# Patient Record
Sex: Male | Born: 1985 | Race: Black or African American | Hispanic: No | Marital: Married | Smoking: Never smoker
Health system: Southern US, Community
[De-identification: ages and names within clinical notes are randomized; demographics above are authoritative.]

## PROBLEM LIST (undated history)

## (undated) DIAGNOSIS — I10 Essential (primary) hypertension: Secondary | ICD-10-CM

## (undated) DIAGNOSIS — K5792 Diverticulitis of intestine, part unspecified, without perforation or abscess without bleeding: Secondary | ICD-10-CM

## (undated) HISTORY — PX: FEMUR FRACTURE SURGERY: SHX633

---

## 2005-09-13 ENCOUNTER — Emergency Department (HOSPITAL_COMMUNITY): Admission: EM | Admit: 2005-09-13 | Discharge: 2005-09-14 | Payer: Self-pay | Admitting: Emergency Medicine

## 2011-02-24 ENCOUNTER — Emergency Department (HOSPITAL_COMMUNITY): Payer: Self-pay

## 2011-02-24 ENCOUNTER — Emergency Department (HOSPITAL_COMMUNITY)
Admission: EM | Admit: 2011-02-24 | Discharge: 2011-02-24 | Disposition: A | Discharge status: Home / Self Care | Payer: Self-pay | Attending: Emergency Medicine | Admitting: Emergency Medicine

## 2011-02-24 DIAGNOSIS — M79609 Pain in unspecified limb: Secondary | ICD-10-CM | POA: Insufficient documentation

## 2011-02-24 DIAGNOSIS — M109 Gout, unspecified: Secondary | ICD-10-CM | POA: Insufficient documentation

## 2011-02-24 LAB — CBC
HCT: 45 % (ref 39.0–52.0)
Hemoglobin: 15.7 g/dL (ref 13.0–17.0)
RBC: 5.57 MIL/uL (ref 4.22–5.81)

## 2011-02-24 LAB — URIC ACID: Uric Acid, Serum: 8.5 mg/dL — ABNORMAL HIGH (ref 4.0–7.8)

## 2011-09-28 ENCOUNTER — Encounter (HOSPITAL_COMMUNITY): Payer: Self-pay | Admitting: General Practice

## 2011-09-28 ENCOUNTER — Observation Stay (HOSPITAL_COMMUNITY)
Admission: EM | Admit: 2011-09-28 | Discharge: 2011-09-28 | Disposition: A | Discharge status: Home / Self Care | Payer: Self-pay | Attending: Emergency Medicine | Admitting: Emergency Medicine

## 2011-09-28 DIAGNOSIS — E119 Type 2 diabetes mellitus without complications: Secondary | ICD-10-CM | POA: Insufficient documentation

## 2011-09-28 DIAGNOSIS — L02215 Cutaneous abscess of perineum: Secondary | ICD-10-CM

## 2011-09-28 DIAGNOSIS — J029 Acute pharyngitis, unspecified: Principal | ICD-10-CM | POA: Insufficient documentation

## 2011-09-28 DIAGNOSIS — L02219 Cutaneous abscess of trunk, unspecified: Secondary | ICD-10-CM | POA: Insufficient documentation

## 2011-09-28 DIAGNOSIS — R5381 Other malaise: Secondary | ICD-10-CM | POA: Insufficient documentation

## 2011-09-28 LAB — DIFFERENTIAL
Basophils Absolute: 0 10*3/uL (ref 0.0–0.1)
Basophils Relative: 0 % (ref 0–1)
Eosinophils Absolute: 0 10*3/uL (ref 0.0–0.7)
Eosinophils Relative: 0 % (ref 0–5)
Lymphocytes Relative: 16 % (ref 12–46)
Lymphs Abs: 2.2 10*3/uL (ref 0.7–4.0)
Monocytes Absolute: 1.1 10*3/uL — ABNORMAL HIGH (ref 0.1–1.0)
Monocytes Relative: 8 % (ref 3–12)
Neutro Abs: 10.2 10*3/uL — ABNORMAL HIGH (ref 1.7–7.7)
Neutrophils Relative %: 76 % (ref 43–77)

## 2011-09-28 LAB — COMPREHENSIVE METABOLIC PANEL
ALT: 32 U/L (ref 0–53)
AST: 17 U/L (ref 0–37)
Albumin: 4.2 g/dL (ref 3.5–5.2)
Alkaline Phosphatase: 108 U/L (ref 39–117)
BUN: 16 mg/dL (ref 6–23)
CO2: 23 mEq/L (ref 19–32)
Calcium: 10.4 mg/dL (ref 8.4–10.5)
Chloride: 96 mEq/L (ref 96–112)
Creatinine, Ser: 1.15 mg/dL (ref 0.50–1.35)
GFR calc Af Amer: >90 mL/min (ref >90)
GFR calc non Af Amer: 86 mL/min — ABNORMAL LOW (ref >90)
Glucose, Bld: 671 mg/dL (ref 70–99)
Potassium: 4.5 mEq/L (ref 3.5–5.1)
Sodium: 136 mEq/L (ref 135–145)
Total Bilirubin: 1.6 mg/dL — ABNORMAL HIGH (ref 0.3–1.2)
Total Protein: 8.8 g/dL — ABNORMAL HIGH (ref 6.0–8.3)

## 2011-09-28 LAB — GLUCOSE, CAPILLARY
Glucose-Capillary: >600 mg/dL (ref 70–99)
Glucose-Capillary: 367 mg/dL — ABNORMAL HIGH (ref 70–99)
Glucose-Capillary: 219 mg/dL — ABNORMAL HIGH (ref 70–99)
Glucose-Capillary: 281 mg/dL — ABNORMAL HIGH (ref 70–99)
Glucose-Capillary: 178 mg/dL — ABNORMAL HIGH (ref 70–99)
Glucose-Capillary: 218 mg/dL — ABNORMAL HIGH (ref 70–99)

## 2011-09-28 LAB — CBC
HCT: 43.7 % (ref 39.0–52.0)
Hemoglobin: 15.7 g/dL (ref 13.0–17.0)
MCH: 28.4 pg (ref 26.0–34.0)
MCHC: 35.9 g/dL (ref 30.0–36.0)
MCV: 79 fL (ref 78.0–100.0)
Platelets: 237 10*3/uL (ref 150–400)
RBC: 5.53 MIL/uL (ref 4.22–5.81)
RDW: 12.1 % (ref 11.5–15.5)
WBC: 13.5 10*3/uL — ABNORMAL HIGH (ref 4.0–10.5)

## 2011-09-28 LAB — URINALYSIS, ROUTINE W REFLEX MICROSCOPIC
Bilirubin Urine: NEGATIVE
Glucose, UA: >1000 mg/dL — AB
Ketones, ur: 40 mg/dL — AB
Leukocytes, UA: NEGATIVE
Nitrite: NEGATIVE
Protein, ur: NEGATIVE mg/dL
Specific Gravity, Urine: 1.044 — ABNORMAL HIGH (ref 1.005–1.030)
Urobilinogen, UA: 1 mg/dL (ref 0.0–1.0)
pH: 5.5 (ref 5.0–8.0)

## 2011-09-28 LAB — RAPID STREP SCREEN (MED CTR MEBANE ONLY): Streptococcus, Group A Screen (Direct): NEGATIVE

## 2011-09-28 LAB — POCT I-STAT 3, ART BLOOD GAS (G3+)
Acid-base deficit: 3 mmol/L — ABNORMAL HIGH (ref 0.0–2.0)
O2 Saturation: 99 %
TCO2: 21 mmol/L (ref 0–100)
pCO2 arterial: 30.5 mmHg — ABNORMAL LOW (ref 35.0–45.0)

## 2011-09-28 LAB — URINE MICROSCOPIC-ADD ON

## 2011-09-28 MED ORDER — DEXTROSE 50 % IV SOLN: 25.0000 mL | INTRAVENOUS | Status: DC | PRN

## 2011-09-28 MED ORDER — SODIUM CHLORIDE 0.9 % IV BOLUS (SEPSIS)
1000.0000 mL | Freq: Once | INTRAVENOUS | Status: AC
  Administered 2011-09-28: 1000 mL via INTRAVENOUS

## 2011-09-28 MED ORDER — SODIUM CHLORIDE 0.9 % IV SOLN
INTRAVENOUS | Status: DC
  Administered 2011-09-28: 150 mL/h via INTRAVENOUS
  Administered 2011-09-28: 05:00:00 via INTRAVENOUS

## 2011-09-28 MED ORDER — INSULIN REGULAR BOLUS VIA INFUSION
0.0000 [IU] | Freq: Three times a day (TID) | INTRAVENOUS | Status: DC
  Administered 2011-09-28: 6.4 [IU] via INTRAVENOUS
  Administered 2011-09-28: 4.6 [IU] via INTRAVENOUS
  Filled 2011-09-28 (×2): qty 10

## 2011-09-28 MED ORDER — OXYCODONE-ACETAMINOPHEN 5-325 MG PO TABS: 2.0000 | ORAL_TABLET | ORAL | Status: AC | PRN

## 2011-09-28 MED ORDER — METFORMIN HCL 1000 MG PO TABS: 1000.0000 mg | ORAL_TABLET | Freq: Two times a day (BID) | ORAL | Status: AC

## 2011-09-28 MED ORDER — SODIUM CHLORIDE 0.9 % IV SOLN
INTRAVENOUS | Status: DC
  Administered 2011-09-28: 4.3 [IU]/h via INTRAVENOUS
  Administered 2011-09-28: 7.7 [IU]/h via INTRAVENOUS
  Administered 2011-09-28: 10.4 [IU]/h via INTRAVENOUS
  Filled 2011-09-28 (×2): qty 1

## 2011-09-28 MED ORDER — LIVING WELL WITH DIABETES BOOK
Freq: Once | Status: AC
  Administered 2011-09-28: 14:00:00
  Filled 2011-09-28: qty 1

## 2011-09-28 MED ORDER — SULFAMETHOXAZOLE-TRIMETHOPRIM 800-160 MG PO TABS: 1.0000 | ORAL_TABLET | Freq: Two times a day (BID) | ORAL | Status: AC

## 2011-09-28 NOTE — ED Provider Notes (Signed)
7:36 AM Patient's care assumed in the CDU at am shift change. Report received from [facility]. Pt with new onset diabetes, on hyperglycemia protocol. ABG without acidosis. Electrolytes WNL. Renal function preserved. Plan is to provide diabetic education, ensure ability to tolerate PO, provide assistance with arranging primary care follow-up, and initiate home diabetic rx.  On my assessment, pt is sleeping, comfortable. NSR on telemetry monitor. Lungs CTAB anteriorly. Nml bowel sounds. Will re-assess upon awakening.      9:30 AM Pt eating breakfast. No distress, no additional needs at this time.       12:18 PM Spoke with case manager, who will come to ED to discuss primary care follow-up with patient.     3:40 PM Although pt has not had a blood sugar reading below 150, he has had 5 consecutive measurements below 250. Blood sugar surge in last 2 hours most likely from eating lunch with inadequate carbohydrate coverage. Pt has been given diabetic education packet. He'll be given a prescription for outpatient diabetes clinic followup. He was given a perception for metformin, 1000 mg, twice a day. I discussed with him measuring his blood sugar before meals each day and keeping a log of this. We discussed at length the possible complications of poorly managed diabetes and the patient and his family expressed understanding of these as well as the current plan. The patient will now be discharged home.  Shaaron Adler, [place] 09/28/11 1542

## 2011-09-28 NOTE — ED Notes (Signed)
CBG -- reading "HIGH" (over 600).

## 2011-09-28 NOTE — ED Provider Notes (Signed)
Patient also complains of 3 days of a painful perineal mass on his skin and is worried about an abscess, he has no redness to the skin around the area no trauma no drainage, he is no fever abdominal pain or vomiting. Examination of his groin reveals nontender testicles are nontender scrotal sac but his perineum has a proximally 3 cm diameter tender fluctuant subcutaneous nodule consistent with an abscess without cellulitis.  Procedure: After a time out was taken and a sterile prep, his left perineal region previously being confirmed by me as a fluid collection in the subcutaneous space by limited bedside emergency department ultrasound, was anesthetized with 1% lidocaine with epinephrine and then incised with a #11 scalpel blade with a large amount of purulent drainage, this is a complicated abscess and the loculations were broken up with a forceps, there is no surrounding cellulitis, patient tolerated this well with no apparent immediate complications, dressings are applied and the patient appears stable for discharge home he understands and agrees with this assessment and plan as well. He does not appear to have necrotizing fasciitis cellulitis or sepsis at this time.    Luis Horn, MD 09/29/11 [phone]

## 2011-09-28 NOTE — ED Provider Notes (Signed)
History     CSN: [id number]  Arrival date & time 09/28/11  0040   First MD Initiated Contact with Patient 09/28/11 0130      Chief Complaint  Patient presents with  . Sore Throat     Patient is a 26 y.o. male presenting with weakness. The history is provided by the patient.  Weakness The primary symptoms include dizziness. Primary symptoms do not include headaches, altered mental status, fever, nausea or vomiting. The symptoms began 3 to 5 days ago. The symptoms are worsening. The neurological symptoms are diffuse.  Dizziness also occurs with weakness. Dizziness does not occur with nausea or vomiting.  Additional symptoms include weakness.  Patient  Is a 26 year old obese African American male who reports three-day history of polydipsia, polyuria and increasing with increasing weakness. Weakness became so severe tonight and was associated with intermittent dizziness that he decided to come for evaluation. Patient also reported a mild sore throat. Patient admits to significant family history of diabetes. Patient denies chest pain, shortness of breath, cough or fever. History reviewed. No pertinent past medical history.  No past surgical history on file.  No family history on file.  History  Substance Use Topics  . Smoking status: Never Smoker   . Smokeless tobacco: Not on file  . Alcohol Use: No      Review of Systems  Constitutional: Negative for fever.  HENT: Negative.   Eyes: Negative.   Respiratory: Negative.   Cardiovascular: Negative.   Gastrointestinal: Negative.  Negative for nausea, vomiting, abdominal pain and diarrhea.  Genitourinary: Positive for frequency. Negative for dysuria.  Musculoskeletal: Negative.   Skin: Negative.   Neurological: Positive for dizziness and weakness. Negative for headaches.  Hematological: Negative.   Psychiatric/Behavioral: Negative.  Negative for altered mental status.    Allergies  Review of patient's allergies indicates no  known allergies.  Home Medications   Current Outpatient Rx  Name Route Sig Dispense Refill  . GREEN TEA (CAMILLIA SINENSIS) PO Oral Take 1 tablet by mouth daily before lunch.      BP 134/72  Pulse 101  Temp(Src) 97.8 F (36.6 C) (Oral)  Resp 17  SpO2 95%  Physical Exam  Constitutional: He appears well-developed and well-nourished.  HENT:  Head: Normocephalic and atraumatic.  Mouth/Throat: Uvula is midline and oropharynx is clear and moist. Mucous membranes are dry.  Eyes: Conjunctivae are normal.  Neck: Neck supple.  Cardiovascular: Normal rate and regular rhythm.   Pulmonary/Chest: Effort normal and breath sounds normal.  Abdominal: Soft. Bowel sounds are normal.  Musculoskeletal: Normal range of motion.  Neurological: He is alert.  Skin: Skin is warm and dry.  Psychiatric: He has a normal mood and affect.    ED Course  Procedures  Initial CBG greater than 600. Patient appears clinically dehydrated. Apparent new onset diabetes. Will initiate IV hydration, obtain appropriate screening labs and reassess.  0600: Patient glucose 71 with anion gap of 17. He has been placed on the glucose stabilizer. I have discussed patient with Dr. Hyacinth Meeker. Will obtain ABG and plan for transfer to CDU to continue care at the CTU hyperglycemia protocol.  0630:  ABG shows pH 7.44, CO2 30.5, PO2 124, bicarbonate 20.5, and PCO2 21. Dr. Hyacinth Meeker is aware of these results. Will move to CDU and place in the "CDU Hyperglycemia Protocol". I have discussed this plan with patient and spouse who are both agreeable with plan.  Labs Reviewed  GLUCOSE, CAPILLARY - Abnormal; Notable for the following:  Glucose-Capillary >600 (*)    All other components within normal limits  CBC - Abnormal; Notable for the following:    WBC 13.5 (*)    All other components within normal limits  DIFFERENTIAL - Abnormal; Notable for the following:    Neutro Abs 10.2 (*)    Monocytes Absolute 1.1 (*)    All other  components within normal limits  COMPREHENSIVE METABOLIC PANEL - Abnormal; Notable for the following:    Total Protein 8.8 (*)    Total Bilirubin 1.6 (*)    GFR calc non Af Amer 86 (*)    All other components within normal limits  RAPID STREP SCREEN  URINALYSIS, ROUTINE W REFLEX MICROSCOPIC   No results found.   No diagnosis found.    MDM  HPI/PE and clinical findings c/w 1. New onset diabetes type II (patient with normal pH, no significant acidosis, bicarbonate was 20.5. Patient has been placed on the glucose stabilize, r his last serum glucose was 444. Patient has been transferred to the "CDU Hyperglycemia Protocol for ongoing management of hyperglycemia, education and discharge planning for new-onset type 2 diabetes.        Roma Kayser Liesel Peckenpaugh, NP 09/28/11 [phone]

## 2011-09-28 NOTE — ED Provider Notes (Signed)
Medical screening examination/treatment/procedure(s) were conducted as a shared visit with non-physician practitioner(s) and myself.  I personally evaluated the patient during the encounter  Please see my separate respective documentation pertaining to this patient encounter   Bard Haupert D Davi Rotan, MD 09/28/11 0706 

## 2011-09-28 NOTE — ED Notes (Signed)
Called pharmacy for the second time about needing another insulin drip.

## 2011-09-28 NOTE — ED Provider Notes (Signed)
Medical screening examination/treatment/procedure(s) were conducted as a shared visit with non-physician practitioner(s) and myself.  I personally evaluated the patient during the encounter  Please see my separate respective documentation pertaining to this patient encounter   Vida Roller, MD 09/28/11 [phone]

## 2011-09-28 NOTE — ED Notes (Signed)
Pt to ED c/o sore throat since Wednesday, states difficulty swallowing and "mouth is dry."

## 2011-09-28 NOTE — ED Notes (Signed)
Glucostabilizer pulled up on computer; 40 minutes until next CBG reading.

## 2011-09-28 NOTE — ED Notes (Signed)
[facility] for breakfast tray of carb mod diet.

## 2011-09-28 NOTE — ED Notes (Signed)
Received call from lab regarding elevated glucose @ 671 - will notify MD

## 2011-09-28 NOTE — ED Notes (Signed)
Patient complaining of extreme thirst, urinary frequency, dry mouth, and blurred vision since Wednesday.  Patient states that his throat is "sore", but he feels that it is due to his dry mouth.  Patient denies history of diabetes; CBG checked during assessment -- reading "HIGH" (over 600).  Dry lips and mucous membranes noted upon assessment.  Primary RN and Natalia Leatherwood, FNP notified.  Patient alert and oriented x4; PERRL present.  Family present at bedside.  Upon arrival to room, patient asked to change into gown.  Will continue to monitor.

## 2011-09-28 NOTE — ED Notes (Signed)
BMI is 46.2

## 2011-09-28 NOTE — Progress Notes (Signed)
   CARE MANAGEMENT NOTE 09/28/2011  Patient:  Luis Orozco, Luis Orozco   Account Number:  [id number]  Date Initiated:  09/28/2011  Documentation initiated by:  [facility]  Subjective/Objective Assessment:   DM     Action/Plan:   lives at home with wife   Anticipated DC Date:  09/28/2011   Anticipated DC Plan:  HOME/SELF CARE      DC Planning Services  CM consult  PCP issues      Choice offered to / List presented to:             Status of service:  Completed, signed off Medicare Important Message given?   (If response is "NO", the following Medicare IM given date fields will be blank) Date Medicare IM given:   Date Additional Medicare IM given:    Discharge Disposition:  HOME/SELF CARE  Per UR Regulation:    If discussed at Long Length of Stay Meetings, dates discussed:    Comments:  09/28/2011 1500 Spoke to pt about diabetes and diet. Provided number for outpt diabetes clinic. Gave pt information on [facility] for follow up with diabetes. Pt states he currently has no insurance coverage. Pt will be on Metformin and that medication is available at any pharmacy that offers discount prices. Encouraged pt to shop around for lowest cost. Explained [facility], [facility], [facility] and [facility] has discount prices for medications. Cost can be as low as $4.00. Pt is eligible for [employer] fund but his medication is a generic and can get at discount price at pharmacy. Declined use of ZZ fund at this time. Isidoro Donning RN CCM Case Mgmt phones [phone]

## 2011-09-28 NOTE — Discharge Instructions (Signed)
Diabetes, Type 2 Diabetes is a long-lasting (chronic) disease. In type 2 diabetes, the pancreas does not make enough insulin (a hormone), and the body does not respond normally to the insulin that is made. This type of diabetes was also previously called adult-onset diabetes. It usually occurs after the age of 87, but it can occur at any age.  CAUSES  Type 2 diabetes happens because the pancreasis not making enough insulin or your body has trouble using the insulin that your pancreas does make properly. SYMPTOMS   Drinking more than usual.   Urinating more than usual.   Blurred vision.   Dry, itchy skin.   Frequent infections.   Feeling more tired than usual (fatigue).  DIAGNOSIS The diagnosis of type 2 diabetes is usually made by one of the following tests:  Fasting blood glucose test. You will not eat for at least 8 hours and then take a blood test.   Random blood glucose test. Your blood glucose (sugar) is checked at any time of the day regardless of when you ate.   Oral glucose tolerance test (OGTT). Your blood glucose is measured after you have not eaten (fasted) and then after you drink a glucose containing beverage.  TREATMENT   Healthy eating.   Exercise.   Medicine, if needed.   Monitoring blood glucose.   Seeing your caregiver regularly.  HOME CARE INSTRUCTIONS   Check your blood glucose at least once a day. More frequent monitoring may be necessary, depending on your medicines and on how well your diabetes is controlled. Your caregiver will advise you.   Take your medicine as directed by your caregiver.   Do not smoke.   Make wise food choices. Ask your caregiver for information. Weight loss can improve your diabetes.   Learn about low blood glucose (hypoglycemia) and how to treat it.   Get your eyes checked regularly.   Have a yearly physical exam. Have your blood pressure checked and your blood and urine tested.   Wear a pendant or bracelet saying  that you have diabetes.   Check your feet every night for cuts, sores, blisters, and redness. Let your caregiver know if you have any problems.  SEEK MEDICAL CARE IF:   You have problems keeping your blood glucose in target range.   You have problems with your medicines.   You have symptoms of an illness that do not improve after 24 hours.   You have a sore or wound that is not healing.   You notice a change in vision or a new problem with your vision.   You have a fever.  MAKE SURE YOU:  Understand these instructions.   Will watch your condition.   Will get help right away if you are not doing well or get worse.  Document Released: 06/23/2005 Document Revised: 06/12/2011 Document Reviewed: 12/09/2010 [facility] Patient Information 2012 [place], [place].         Blood Sugar Monitoring, Adult GLUCOSE METERS FOR SELF-MONITORING OF BLOOD GLUCOSE  It is important to be able to correctly measure your blood sugar (glucose). You can use a blood glucose monitor (a small battery-operated device) to check your glucose level at any time. This allows you and your caregiver to monitor your diabetes and to determine how well your treatment plan is working. The process of monitoring your blood glucose with a glucose meter is called self-monitoring of blood glucose (SMBG). When people with diabetes control their blood sugar, they have better health. To test  for glucose with a typical glucose meter, place the disposable strip in the meter. Then place a small sample of blood on the "test strip." The test strip is coated with chemicals that combine with glucose in blood. The meter measures how much glucose is present. The meter displays the glucose level as a number. Several new models can record and store a number of test results. Some models can connect to personal computers to store test results or print them out.  Newer meters are often easier to use than older models. Some meters allow you to get  blood from places other than your fingertip. Some new models have automatic timing, error codes, signals, or barcode readers to help with proper adjustment (calibration). Some meters have a large display screen or spoken instructions for people with visual impairments.  INSTRUCTIONS FOR USING GLUCOSE METERS  Wash your hands with soap and warm water, or clean the area with alcohol. Dry your hands completely.   Prick the side of your fingertip with a lancet (a sharp-pointed tool used by hand).   Hold the hand down and gently milk the finger until a small drop of blood appears. Catch the blood with the test strip.   Follow the instructions for inserting the test strip and using the SMBG meter. Most meters require the meter to be turned on and the test strip to be inserted before applying the blood sample.   Record the test result.   Read the instructions carefully for both the meter and the test strips that go with it. Meter instructions are found in the user manual. Keep this manual to help you solve any problems that may arise. Many meters use "error codes" when there is a problem with the meter, the test strip, or the blood sample on the strip. You will need the manual to understand these error codes and fix the problem.   New devices are available such as laser lancets and meters that can test blood taken from "alternative sites" of the body, other than fingertips. However, you should use standard fingertip testing if your glucose changes rapidly. Also, use standard testing if:   You have eaten, exercised, or taken insulin in the past 2 hours.   You think your glucose is low.   You tend to not feel symptoms of low blood glucose (hypoglycemia).   You are ill or under stress.   Clean the meter as directed by the manufacturer.   Test the meter for accuracy as directed by the manufacturer.   Take your meter with you to your caregiver's office. This way, you can test your glucose in front of  your caregiver to make sure you are using the meter correctly. Your caregiver can also take a sample of blood to test using a routine lab method. If values on the glucose meter are close to the lab results, you and your caregiver will see that your meter is working well and you are using good technique. Your caregiver will advise you about what to do if the results do not match.  FREQUENCY OF TESTING  Your caregiver will tell you how often you should check your blood glucose. This will depend on your type of diabetes, your current level of diabetes control, and your types of medicines. The following are general guidelines, but your care plan may be different. Record all your readings and the time of day you took them for review with your caregiver.   Diabetes type 1.   When  you are using insulin with good diabetic control (either multiple daily injections or via a pump), you should check your glucose 4 times a day.   If your diabetes is not well controlled, you may need to monitor more frequently, including before meals and 2 hours after meals, at bedtime, and occasionally between 2 a.m. and 3 a.m.   You should always check your glucose before a dose of insulin or before changing the rate on your insulin pump.   Diabetes type 2.   Guidelines for SMBG in diabetes type 2 are not as well defined.   If you are on insulin, follow the guidelines above.   If you are on medicines, but not insulin, and your glucose is not well controlled, you should test at least twice daily.   If you are not on insulin, and your diabetes is controlled with medicines or diet alone, you should test at least once daily, usually before breakfast.   A weekly profile will help your caregiver advise you on your care plan. The week before your visit, check your glucose before a meal and 2 hours after a meal at least daily. You may want to test before and after a different meal each day so you and your caregiver can tell how  well controlled your blood sugars are throughout the course of a 24 hour period.   Gestational diabetes (diabetes during pregnancy).   Frequent testing is often necessary. Accurate timing is important.   If you are not on insulin, check your glucose 4 times a day. Check it before breakfast and 1 hour after the start of each meal.   If you are on insulin, check your glucose 6 times a day. Check it before each meal and 1 hour after the first bite of each meal.   General guidelines.   More frequent testing is required at the start of insulin treatment. Your caregiver will instruct you.   Test your glucose any time you suspect you have low blood sugar (hypoglycemia).   You should test more often when you change medicines, when you have unusual stress or illness, or in other unusual circumstances.  OTHER THINGS TO KNOW ABOUT GLUCOSE METERS  Measurement Range. Most glucose meters are able to read glucose levels over a broad range of values from as low as 0 to as high as 600 mg/dL. If you get an extremely high or low reading from your meter, you should first confirm it with another reading. Report very high or very low readings to your caregiver.   Whole Blood Glucose versus Plasma Glucose. Some older home glucose meters measure glucose in your whole blood. In a lab or when using some newer home glucose meters, the glucose is measured in your plasma (one component of blood). The difference can be important. It is important for you and your caregiver to know whether your meter gives its results as "whole blood equivalent" or "plasma equivalent."   Display of High and Low Glucose Values. Part of learning how to operate a meter is understanding what the meter results mean. Know how high and low glucose concentrations are displayed on your meter.   Factors that Affect Glucose Meter Performance. The accuracy of your test results depends on many factors and varies depending on the brand and type of  meter. These factors include:   Low red blood cell count (anemia).   Substances in your blood (such as uric acid, vitamin C, and others).   Environmental factors (temperature,  humidity, altitude).   Name-brand versus generic test strips.   Calibration. Make sure your meter is set up properly. It is a good idea to do a calibration test with a control solution recommended by the manufacturer of your meter whenever you begin using a fresh bottle of test strips. This will help verify the accuracy of your meter.   Improperly stored, expired, or defective test strips. Keep your strips in a dry place with the lid on.   Soiled meter.   Inadequate blood sample.  NEW TECHNOLOGIES FOR GLUCOSE TESTING Alternative site testing Some glucose meters allow testing blood from alternative sites. These include the:  Upper arm.   Forearm.   Base of the thumb.   Thigh.  Sampling blood from alternative sites may be desirable. However, it may have some limitations. Blood in the fingertips show changes in glucose levels more quickly than blood in other parts of the body. This means that alternative site test results may be different from fingertip test results, not because of the meter's ability to test accurately, but because the actual glucose concentration can be different.  Continuous Glucose Monitoring Devices to measure your blood glucose continuously are available, and others are in development. These methods can be more expensive than self-monitoring with a glucose meter. However, it is uncertain how effective and reliable these devices are. Your caregiver will advise you if this approach makes sense for you. IF BLOOD SUGARS ARE CONTROLLED, PEOPLE WITH DIABETES REMAIN HEALTHIER.  SMBG is an important part of the treatment plan of patients with diabetes mellitus. Below are reasons for using SMBG:   It confirms that your glucose is at a specific, healthy level.   It detects hypoglycemia and severe  hyperglycemia.   It allows you and your caregiver to make adjustments in response to changes in lifestyle for individuals requiring medicine.   It determines the need for starting insulin therapy in temporary diabetes that happens during pregnancy (gestational diabetes).  Document Released: 06/26/2003 Document Revised: 06/12/2011 Document Reviewed: 10/17/2010 [facility] Patient Information 2012 [place], [place].         Diabetes Meal Planning Guide The diabetes meal planning guide is a tool to help you plan your meals and snacks. It is important for people with diabetes to manage their blood glucose (sugar) levels. Choosing the right foods and the right amounts throughout your day will help control your blood glucose. Eating right can even help you improve your blood pressure and reach or maintain a healthy weight. CARBOHYDRATE COUNTING MADE EASY When you eat carbohydrates, they turn to sugar. This raises your blood glucose level. Counting carbohydrates can help you control this level so you feel better. When you plan your meals by counting carbohydrates, you can have more flexibility in what you eat and balance your medicine with your food intake. Carbohydrate counting simply means adding up the total amount of carbohydrate grams in your meals and snacks. Try to eat about the same amount at each meal. Foods with carbohydrates are listed below. Each portion below is 1 carbohydrate serving or 15 grams of carbohydrates. Ask your dietician how many grams of carbohydrates you should eat at each meal or snack. Grains and Starches  1 slice bread.    English muffin or hotdog/hamburger bun.    cup cold cereal (unsweetened).   ? cup cooked pasta or rice.    cup starchy vegetables (corn, potatoes, peas, beans, winter squash).   1 tortilla (6 inches).    bagel.   1  waffle or pancake (size of a CD).    cup cooked cereal.   4 to 6 small crackers.  *Whole grain is  recommended. Fruit  1 cup fresh unsweetened berries, melon, papaya, pineapple.   1 small fresh fruit.    banana or mango.    cup fruit juice (4 oz unsweetened).    cup canned fruit in natural juice or water.   2 tbs dried fruit.   12 to 15 grapes or cherries.  Milk and Yogurt  1 cup fat-free or 1% milk.   1 cup soy milk.   6 oz light yogurt with sugar-free sweetener.   6 oz low-fat soy yogurt.   6 oz plain yogurt.  Vegetables  1 cup raw or  cup cooked is counted as 0 carbohydrates or a "free" food.   If you eat 3 or more servings at 1 meal, count them as 1 carbohydrate serving.  Other Carbohydrates   oz chips or pretzels.    cup ice cream or frozen yogurt.    cup sherbet or sorbet.   2 inch square cake, no frosting.   1 tbs honey, sugar, jam, jelly, or syrup.   2 small cookies.   3 squares of graham crackers.   3 cups popcorn.   6 crackers.   1 cup broth-based soup.   Count 1 cup casserole or other mixed foods as 2 carbohydrate servings.   Foods with less than 20 calories in a serving may be counted as 0 carbohydrates or a "free" food.  You may want to purchase a book or computer software that lists the carbohydrate gram counts of different foods. In addition, the nutrition facts panel on the labels of the foods you eat are a good source of this information. The label will tell you how big the serving size is and the total number of carbohydrate grams you will be eating per serving. Divide this number by 15 to obtain the number of carbohydrate servings in a portion. Remember, 1 carbohydrate serving equals 15 grams of carbohydrate. SERVING SIZES Measuring foods and serving sizes helps you make sure you are getting the right amount of food. The list below tells how big or small some common serving sizes are.  1 oz.........4 stacked dice.   3 oz........Marland KitchenDeck of cards.   1 tsp.......Marland KitchenTip of little finger.   1 tbs......Marland KitchenMarland KitchenThumb.   2  tbs.......Marland KitchenGolf ball.    cup......Marland KitchenHalf of a fist.   1 cup.......Marland KitchenA fist.  SAMPLE DIABETES MEAL PLAN Below is a sample meal plan that includes foods from the grain and starches, dairy, vegetable, fruit, and meat groups. A dietician can individualize a meal plan to fit your calorie needs and tell you the number of servings needed from each food group. However, controlling the total amount of carbohydrates in your meal or snack is more important than making sure you include all of the food groups at every meal. You may interchange carbohydrate containing foods (dairy, starches, and fruits). The meal plan below is an example of a 2000 calorie diet using carbohydrate counting. This meal plan has 17 carbohydrate servings. Breakfast  1 cup oatmeal (2 carb servings).    cup light yogurt (1 carb serving).   1 cup blueberries (1 carb serving).    cup almonds.  Snack  1 large apple (2 carb servings).   1 low-fat string cheese stick.  Lunch  Chicken breast salad.   1 cup spinach.    cup chopped tomatoes.   2 oz  chicken breast, sliced.   2 tbs low-fat [place] dressing.   12 whole-wheat crackers (2 carb servings).   12 to 15 grapes (1 carb serving).   1 cup low-fat milk (1 carb serving).  Snack  1 cup carrots.    cup hummus (1 carb serving).  Dinner  3 oz broiled salmon.   1 cup brown rice (3 carb servings).  Snack  1  cups steamed broccoli (1 carb serving) drizzled with 1 tsp olive oil and lemon juice.   1 cup light pudding (2 carb servings).  DIABETES MEAL PLANNING WORKSHEET Your dietician can use this worksheet to help you decide how many servings of foods and what types of foods are right for you.  BREAKFAST Food Group and Servings / Carb Servings Grain/Starches __________________________________ Dairy __________________________________________ Vegetable ______________________________________ Fruit ___________________________________________ Meat  __________________________________________ Fat ____________________________________________ LUNCH Food Group and Servings / Carb Servings Grain/Starches ___________________________________ Dairy ___________________________________________ Fruit ____________________________________________ Meat ___________________________________________ Fat _____________________________________________ [place] Food Group and Servings / Carb Servings Grain/Starches ___________________________________ Dairy ___________________________________________ Fruit ____________________________________________ Meat ___________________________________________ Fat _____________________________________________ SNACKS Food Group and Servings / Carb Servings Grain/Starches ___________________________________ Dairy ___________________________________________ Vegetable _______________________________________ Fruit ____________________________________________ Meat ___________________________________________ Fat _____________________________________________ DAILY TOTALS Starches _________________________ Vegetable ________________________ Fruit ____________________________ Dairy ____________________________ Meat ____________________________ Fat ______________________________ Document Released: 03/20/2005 Document Revised: 06/12/2011 Document Reviewed: 01/29/2009 [employer] Patient Information 2012 [place], [place].Complementary and Alternative Medical Therapies for Diabetes Complementary and alternative medicines are health care practices or products that are not always accepted as part of routine medicine. Complementary medicine is used along with routine medicine (medical therapy). Alternative medicine can sometimes be used instead of routine medicine. Some people use these methods to treat diabetes. While some of these therapies may be effective, others may not be. Some may even be harmful. Patients using these methods  need to tell their caregiver. It is important to let your caregivers know what you are doing. Some of these therapies are discussed below. For more information, talk with your caregiver. THERAPIES Acupuncture Acupuncture is done by a professional who inserts needles into certain points on the skin. Some scientists believe that this triggers the release of the body's natural painkillers. It has been shown to relieve long-term (chronic) pain. This may help patients with painful nerve damage caused by diabetes. Biofeedback Biofeedback helps a person become more aware of the body's response to pain. It also helps you learn to deal with the pain. This alternative therapy focuses on relaxation and stress-reduction techniques. Thinking of peaceful mental images (guided imagery) is one technique. Some people believe these images can ease their condition. MEDICATIONS Chromium Several studies report that chromium supplements may improve diabetes control. Chromium helps insulin improve its action. Research is not yet certain. Supplements have not been recommended or approved. Caution is needed if you have kidney (renal) problems. Ginseng There are several types of ginseng plants. American ginseng is used for diabetes studies. Those studies have shown some glucose-lowering effects. Those effects have been seen with fasting and after-meal blood glucose levels. They have also been seen in A1c levels (average blood glucose levels over a 57-month period). More long-term studies are needed before recommendations for use of ginseng can be made. Magnesium Experts have studied the relationship between magnesium and diabetes for many years. But it is not yet fully understood. Studies suggest that a low amount of magnesium may make blood glucose control worse in type 2 diabetes. Research also shows that a low amount may contribute to certain diabetes complications. One study showed that people who  consume more magnesium had  less risk of type 2 diabetes. Eating whole grains, nuts, and green leafy vegetables raises the magnesium level. Vanadium Vanadium is a compound found in tiny amounts in plants and animals. Early studies showed that vanadium improved blood glucose levels in animals with type 1 and type 2 diabetes. One study found that when given vanadium, those with diabetes were able to decrease their insulin dosage. Researchers still need to learn how it works in the body to discover any side effects, and to find safe dosages. Cinnamon There have been a couple of studies that seem to indicate cinnamon decreases insulin resistance and increases insulin production. By doing so, it may lower blood glucose. Exact doses are unknown, but it may work best when used in combination with other diabetes medicines. Document Released: 04/20/2007 Document Revised: 06/12/2011 Document Reviewed: 05/03/2009 [facility] Patient Information 2012 [place], [place].Abscess An abscess (boil or furuncle) is an infected area that contains a collection of pus.  SYMPTOMS Signs and symptoms of an abscess include pain, tenderness, redness, or hardness. You may feel a moveable soft area under your skin. An abscess can occur anywhere in the body.  TREATMENT  A surgical cut (incision) may be made over your abscess to drain the pus. Gauze may be packed into the space or a drain may be looped through the abscess cavity (pocket). This provides a drain that will allow the cavity to heal from the inside outwards. The abscess may be painful for a few days, but should feel much better if it was drained.  Your abscess, if seen early, may not have localized and may not have been drained. If not, another appointment may be required if it does not get better on its own or with medications. HOME CARE INSTRUCTIONS   Only take over-the-counter or prescription medicines for pain, discomfort, or fever as directed by your caregiver.   Take your antibiotics as  directed if they were prescribed. Finish them even if you start to feel better.   Keep the skin and clothes clean around your abscess.   If the abscess was drained, you will need to use gauze dressing to collect any draining pus. Dressings will typically need to be changed 3 or more times a day.   The infection may spread by skin contact with others. Avoid skin contact as much as possible.   Practice good hygiene. This includes regular hand washing, cover any draining skin lesions, and do not share personal care items.   If you participate in sports, do not share athletic equipment, towels, whirlpools, or personal care items. Shower after every practice or tournament.   If a draining area cannot be adequately covered:   Do not participate in sports.   Children should not participate in day care until the wound has healed or drainage stops.   If your caregiver has given you a follow-up appointment, it is very important to keep that appointment. Not keeping the appointment could result in a much worse infection, chronic or permanent injury, pain, and disability. If there is any problem keeping the appointment, you must call back to this facility for assistance.  SEEK MEDICAL CARE IF:   You develop increased pain, swelling, redness, drainage, or bleeding in the wound site.   You develop signs of generalized infection including muscle aches, chills, fever, or a general ill feeling.   You have an oral temperature above 102 F (38.9 C).  MAKE SURE YOU:   Understand these instructions.  Will watch your condition.   Will get help right away if you are not doing well or get worse.  Document Released: 04/02/2005 Document Revised: 06/12/2011 Document Reviewed: 01/25/2008 [facility] Patient Information 2012 [place], [place].  Return sooner if you develop fever, abdominal pain, vomiting, redness around her groin area, severe pain to the groin area, lightheadedness, or other concerns. Your  abscess area draining and rinsed it out with warm water 3-4 times daily. Recheck in your doctor or the emergency department or urgent care in 2 days.

## 2011-09-28 NOTE — ED Notes (Signed)
26 year old obese male presents with generalized weakness, polyuria, polydipsia. Symptoms have been gradually worsening over the last several days, severe on arrival and associated with some lightheadedness. There is a family history significant for diabetes  Physical exam:  Abdomen soft, lungs clear, heart with mild tachycardia initially, no peripheral edema or rashes. His membranes dehydrated  Assessment:    Patient appears dehydrated secondary to what appears to be a new onset diabetes. Blood work shows that there is no significant acidosis we does have a mild anion gap. ABG confirms normal pH. Bicarbonate normal. Sodium and potassium normal, elevated blood sugar. Insulin by glucose stabilizer ordered, IV fluids given, recheck CBG shows improved, we'll place some hyperglycemia protocol, started outpatient medications. Will need outpatient referrals for family Dr.  Medical screening examination/treatment/procedure(s) were conducted as a shared visit with non-physician practitioner(s) and myself.  I personally evaluated the patient during the encounter   Vida Roller, MD 09/28/11 [phone]

## 2011-09-28 NOTE — ED Notes (Signed)
Received bedside report from Foster, Charity fundraiser.  Patient moved from exam room 07 to CDU 01.  Patient currently sitting up in bed; no respiratory or acute distress noted.  Family at bedside.  Patient given ice water to drink and placed call bell at bedside.  Will continue to monitor.

## 2011-09-28 NOTE — ED Notes (Signed)
Cultures were entered under this pt's name by mistake and could not discontinue the orders. Called lab and they said they would discontinue the orders for me.

## 2011-09-30 ENCOUNTER — Encounter: Payer: Self-pay | Attending: Pediatrics | Admitting: Dietician

## 2011-09-30 DIAGNOSIS — Z713 Dietary counseling and surveillance: Secondary | ICD-10-CM | POA: Insufficient documentation

## 2011-09-30 DIAGNOSIS — E119 Type 2 diabetes mellitus without complications: Secondary | ICD-10-CM | POA: Insufficient documentation

## 2011-10-01 ENCOUNTER — Encounter: Payer: Self-pay | Admitting: Dietician

## 2011-10-01 NOTE — Progress Notes (Signed)
Medical Nutrition Therapy:  Appt start time: 1645 end time:  1745  Assessment:  Primary concerns today: Diagnosed with type 2 diabetes day-before yesterday (09/28/11).  A1C is 12.1 % (Avg glucose of298 mg/dl).  ED admitting glucose was >600 with an altered state of consciousness according to the family.  At the time he was found to have a boil located behind the testicle area.  According to the family, while lowering his blood glucose, the area was lanced and drained, and he was started on an antibiotic.  Has a strong family history for diabetes.  His paternal grandmother died from the complications of diabetes.  He is quite anxious regarding the diagnosis and is seeking answers and support for dealing with the diabetes and "staying as well as I can."  His mother accompanied him to this appointment.   Currently, he has no primary MD.  Suggested he explore finding an MD at [facility] or at [facility].  Also recommended that her contact the [facility] billing department regarding a payment plan or the possibility of being evaluated for an [employer] for [employer] with [employer].  BLOOD GLUCOSE:  Fasting: 248 Post Meals: 288, 306, 245 He does not have a glucose meter of his own, I provided him with a handout for the [employer] Reli-ON meter, strips and lancets.  He felt that he might be able to afford the meter. Currently his Aunt is checking his blood glucose levels with her meter.  MEDICATIONS: Current diabetes medication is Metformin 1000 mg PC for the AM and PM  DIETARY INTAKE:  24-hr recall:  B (8:00 AM): 4 eggs, fried chicken, biscuit with gravy and 16-32 oz of sweet tea.  Snk (mid- AM) :Usually none  L (11:30-12:00 PM): 2 chicken sandwiches or shrimp and grits or 2 double cheeseburgers with mayo and mustard and either 32 oz of sweet tea or Dr. Reino Kent.  Snk (mid PM): None D (02-12-09:00 PM): Fried chicken, or fried pork chop with mash potatoes (1-2 cups) and gravy and biscuit/biscuits.   Snk (HS PM): Usually no snacks Beverages: Sweet tea, Dr. Reino Kent or regular soda  Recent physical activity: No regular exercise regimen.  Works at the 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    Estimated energy needs:HT:72 in  WT: 333.2 lb (151.5 kg)  BMI: 45.3  Adj WT: 237 lb (108 kg) 1800-1900 calories 205-210 g carbohydrates 135-140 g protein 50-52 g fat  Progress Towards Goal(s):  No progress.   Nutritional Diagnosis:  [place]-2.1 Inpaired nutrition utilization As related to glucose.  As evidenced by Recent infection with glucose of >600 mg and A1C of 12.1%.    Intervention:  Nutrition Currently he is morbidly obese and with his diet recall he admits that he eats large portions and will have 2 or sometimes 3 servings.  He has agreed that he will start to decrease his serving size.  He noted in the last 24 hours, he is drinking more water and is using a sugar substitute packet to sweeten his water and has found that to be acceptable.  His mother has tried to help steer him in the direction of balanced meals lower in carb and fried foods.  He agrees to try to bake, broil, grill, roast and avoid fried foods.  Today, he is over whelmed with the diagnosis and is fearful for his future.  Provided him with dietary interventions that  were possible for him.    Handouts given during visit include:  Diet prescription for 75 gm of CHO per meal  Menus for the 75 gm of CHO  List of non-starchy vegetables  Poster using the Plate Method for limiting CHO serving sizes  Monitoring/Evaluation:  Dietary intake, exercise, blood glucose levesl, and body weight in 8 to 12 weeks.

## 2013-04-06 ENCOUNTER — Ambulatory Visit: Payer: Self-pay | Admitting: Podiatry

## 2013-04-13 ENCOUNTER — Encounter: Payer: Self-pay | Admitting: Podiatry

## 2013-04-13 ENCOUNTER — Ambulatory Visit (INDEPENDENT_AMBULATORY_CARE_PROVIDER_SITE_OTHER): Payer: BC Managed Care – PPO | Admitting: Podiatry

## 2013-04-13 VITALS — BP 157/90 | HR 76 | Ht 72.0 in | Wt 322.0 lb

## 2013-04-13 DIAGNOSIS — M21969 Unspecified acquired deformity of unspecified lower leg: Secondary | ICD-10-CM

## 2013-04-13 DIAGNOSIS — M21619 Bunion of unspecified foot: Secondary | ICD-10-CM | POA: Insufficient documentation

## 2013-04-13 DIAGNOSIS — M2010 Hallux valgus (acquired), unspecified foot: Secondary | ICD-10-CM

## 2013-04-13 DIAGNOSIS — B351 Tinea unguium: Secondary | ICD-10-CM

## 2013-04-13 DIAGNOSIS — M79609 Pain in unspecified limb: Secondary | ICD-10-CM

## 2013-04-13 MED ORDER — EFINACONAZOLE 10 % EX SOLN: 1.0000 [drp] | Freq: Every evening | CUTANEOUS | Status: AC

## 2013-04-13 NOTE — Progress Notes (Signed)
Been diabetic x 1 year. Last blood sugar checked was 140 2 weeks ago. Wants general check. Both big toes been hurting. The big toe on right seems to curling up and is changing. On feet about 8 hours a day.   Review of Systems - General ROS: negative for - chills, fatigue, malaise, night sweats, sleep disturbance or weight gain Ophthalmic ROS: negative ENT ROS: negative Respiratory ROS: no cough, shortness of breath, or wheezing Cardiovascular ROS: no chest pain or dyspnea on exertion Gastrointestinal ROS: no abdominal pain, change in bowel habits, or black or bloody stools Genito-Urinary ROS: no dysuria, trouble voiding, or hematuria Musculoskeletal ROS: negative Neurological ROS: no TIA or stroke symptoms Dermatological ROS: negative.  Objective: Vascular: All pedal pulses are palpable. Dermatologic: Discolored and dystrophic nails x 10. Orthopedic: Severe Hallux Valgus with bunion bilateral. Neurologic: All epicritic and tactile sensations grossly intact.  Radiographic examination reveal adductus type foot with severe hallux valgus with bunion. Lower calcaneal pitch bilateral. Increased lateral deviation of the calcaneocuboid angle bilateral.   Assessment: Hallux valgus with bunion deformity bilateral. 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  Plan: Reviewed clinical findings and available treatment options. May benefit from antifungal medication (Jublia). Will return for Orthotics.

## 2013-04-13 NOTE — Patient Instructions (Signed)
Seen for diabetic foot. Abnormal deformed and fungal infected nail. X-ray show significant bunion deformity with flat foot. Apply Jublia topical medication on all toe nails. Will get approval for Orthotics and will call.

## 2014-09-06 ENCOUNTER — Emergency Department (HOSPITAL_BASED_OUTPATIENT_CLINIC_OR_DEPARTMENT_OTHER)
Admission: EM | Admit: 2014-09-06 | Discharge: 2014-09-06 | Disposition: A | Discharge status: Home / Self Care | Payer: Managed Care, Other (non HMO) | Attending: Emergency Medicine | Admitting: Emergency Medicine

## 2014-09-06 ENCOUNTER — Emergency Department (HOSPITAL_BASED_OUTPATIENT_CLINIC_OR_DEPARTMENT_OTHER): Payer: Managed Care, Other (non HMO)

## 2014-09-06 DIAGNOSIS — M7052 Other bursitis of knee, left knee: Secondary | ICD-10-CM

## 2014-09-06 DIAGNOSIS — Z79899 Other long term (current) drug therapy: Secondary | ICD-10-CM | POA: Diagnosis not present

## 2014-09-06 DIAGNOSIS — M25562 Pain in left knee: Secondary | ICD-10-CM | POA: Diagnosis present

## 2014-09-06 DIAGNOSIS — M71562 Other bursitis, not elsewhere classified, left knee: Secondary | ICD-10-CM | POA: Diagnosis not present

## 2014-09-06 DIAGNOSIS — E119 Type 2 diabetes mellitus without complications: Secondary | ICD-10-CM | POA: Diagnosis not present

## 2014-09-06 MED ORDER — NAPROXEN 500 MG PO TABS: 500.0000 mg | ORAL_TABLET | Freq: Two times a day (BID) | ORAL | Status: DC

## 2014-09-06 NOTE — ED Notes (Signed)
Pt states swelling of left knee starting 2 weeks ago; knee is swelling with bogginess

## 2014-09-06 NOTE — ED Notes (Signed)
C/o left knee pain and swelling x 2-3 weeks  Is in firefighter training  A lot of crawling on knees

## 2014-09-06 NOTE — ED Provider Notes (Signed)
CSN: [id number]     Arrival date & time 09/06/14  1924 History   First MD Initiated Contact with Patient 09/06/14 1939     Chief Complaint  Patient presents with  . Knee Pain     (Consider location/radiation/quality/duration/timing/severity/associated sxs/prior Treatment) HPI Comments: 29 year old male complaining of left knee pain 3 weeks, worsening over the past few days. 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 Pain worse with activity. No alleviating factors. No prior injury to his left knee. It started swelling 2 weeks ago. No specific injury that he can recall. Denies color change, fever or warmth.  Patient is a 29 y.o. male presenting with knee pain. The history is provided by the patient.  Knee Pain Location:  Knee Knee location:  L knee Pain details:    Radiates to:  Does not radiate   Onset quality:  Gradual   Timing:  Constant   Progression:  Worsening Chronicity:  New Prior injury to area:  No Worsened by:  Activity   Past Medical History  Diagnosis Date  . Diabetes mellitus    Past Surgical History  Procedure Laterality Date  . Femur fracture surgery     Family History  Problem Relation Age of Onset  . Diabetes Father   . Hyperlipidemia Father   . Hypertension Father   . Heart attack Father   . Obesity Father   . Diabetes Brother   . Diabetes Paternal Grandmother   . Diabetes Brother    History  Substance Use Topics  . Smoking status: Never Smoker   . Smokeless tobacco: Never Used  . Alcohol Use: No    Review of Systems  Musculoskeletal:       +L knee pain and swelling.  All other systems reviewed and are negative.     Allergies  Review of patient's allergies indicates no known allergies.  Home Medications   Prior to Admission medications   Medication Sig Start Date End Date Taking? Authorizing Provider  Efinaconazole (JUBLIA) 10 % SOLN Apply 1 drop topically Nightly. 04/13/13   Myeong  Sheard, DPM  GREEN TEA, CAMILLIA SINENSIS, PO Take 1 tablet by mouth daily before lunch.    Historical Provider, MD  naproxen (NAPROSYN) 500 MG tablet Take 1 tablet (500 mg total) by mouth 2 (two) times daily. 09/06/14   Roselin Wiemann M Farida Mcreynolds, PA-C   BP 156/65 mmHg  Pulse 65  Temp(Src) 98 F (36.7 C) (Oral)  Resp 18  Ht 6' (1.829 m)  Wt 298 lb (135.172 kg)  BMI 40.41 kg/m2  SpO2 98% Physical Exam  Constitutional: He is oriented to person, place, and time. He appears well-developed and well-nourished. No distress.  HENT:  Head: Normocephalic and atraumatic.  Eyes: Conjunctivae and EOM are normal.  Neck: Normal range of motion. Neck supple.  Cardiovascular: Normal rate, regular rhythm and normal heart sounds.   Pulmonary/Chest: Effort normal and breath sounds normal.  Musculoskeletal:  L knee TTP anterior over patella with bogginess and swelling. FROM, pain with flexion. No erythema or warmth.  Neurological: He is alert and oriented to person, place, and time.  Skin: Skin is warm and dry.  Psychiatric: He has a normal mood and affect. His behavior is normal.  Nursing note and vitals reviewed.   ED Course  Procedures (including critical care time) Labs Review Labs Reviewed - No data to display  Imaging Review Dg Knee Complete 4 Views Left  09/06/2014   CLINICAL  DATA:  Left knee swelling for 2 weeks.  EXAM: LEFT KNEE - COMPLETE 4+ VIEW  COMPARISON:  None.  FINDINGS: Soft tissue swelling is seen anterior the patella compatible with prepatellar bursitis. There is no joint effusion. No fracture is identified. There is an osteochondral lesion of the medial femoral condyle measuring approximately 2.1 cm AP x 2.3 cm transverse. Joint spaces are preserved.  IMPRESSION: Prepatellar soft tissue swelling is compatible with bursitis.  Negative for acute bony or joint abnormality.  Osteochondral lesion medial femoral condyle. Nonemergent MRI could be used for further evaluation.   Electronically Signed   By:  Drusilla Kannerhomas  Dalessio M.D.   On: 09/06/2014 20:12     EKG Interpretation None      MDM   Final diagnoses:  Bursitis of left knee   NAD. Neurovascularly intact. No redness, warmth concerning for joint infection. X-ray results as stated above. Knee sleeve applied. Advised RICE, NSAIDs. Rx naproxen. Discussed osteochondral lesion finding. He will follow-up with Dr. Pearletha ForgeHudnall, sports medicine. Stable for d/c. Return precautions given. Patient states understanding of treatment care plan and is agreeable.   Kathrynn SpeedRobyn M Sireen Halk, PA-C 09/06/14 2035  Pricilla LovelessScott Goldston, MD 09/12/14 0005

## 2014-09-06 NOTE — Discharge Instructions (Signed)
Take naproxen as prescribed. Follow up with Dr. Pearletha Forge. Your Xray also mentions Osteochondral lesion medial femoral condyle. Nonemergent MRI could be used for further evaluation. Discuss this with Dr. Pearletha Forge.  Bursitis Bursitis is a swelling and soreness (inflammation) of a fluid-filled sac (bursa) that overlies and protects a joint. It can be caused by injury, overuse of the joint, arthritis or infection. The joints most likely to be affected are the elbows, shoulders, hips and knees. HOME CARE INSTRUCTIONS   Apply ice to the affected area for 15-20 minutes each hour while awake for 2 days. Put the ice in a plastic bag and place a towel between the bag of ice and your skin.  Rest the injured joint as much as possible, but continue to put the joint through a full range of motion, 4 times per day. (The shoulder joint especially becomes rapidly "frozen" if not used.) When the pain lessens, begin normal slow movements and usual activities.  Only take over-the-counter or prescription medicines for pain, discomfort or fever as directed by your caregiver.  Your caregiver may recommend draining the bursa and injecting medicine into the bursa. This may help the healing process.  Follow all instructions for follow-up with your caregiver. This includes any orthopedic referrals, physical therapy and rehabilitation. Any delay in obtaining necessary care could result in a delay or failure of the bursitis to heal and chronic pain. SEEK IMMEDIATE MEDICAL CARE IF:   Your pain increases even during treatment.  You develop an oral temperature above 102 F (38.9 C) and have heat and inflammation over the involved bursa. MAKE SURE YOU:   Understand these instructions.  Will watch your condition.  Will get help right away if you are not doing well or get worse. Document Released: 06/20/2000 Document Revised: 09/15/2011 Document Reviewed: 09/12/2013 [facility] Patient Information 2015 [place], [place]. This  information is not intended to replace advice given to you by your health care provider. Make sure you discuss any questions you have with your health care provider. RICE: Routine Care for Injuries The routine care of many injuries includes Rest, Ice, Compression, and Elevation (RICE). HOME CARE INSTRUCTIONS  Rest is needed to allow your body to heal. Routine activities can usually be resumed when comfortable. Injured tendons and bones can take up to 6 weeks to heal. Tendons are the cord-like structures that attach muscle to bone.  Ice following an injury helps keep the swelling down and reduces pain.  Put ice in a plastic bag.  Place a towel between your skin and the bag.  Leave the ice on for 15-20 minutes, 3-4 times a day, or as directed by your health care provider. Do this while awake, for the first 24 to 48 hours. After that, continue as directed by your caregiver.  Compression helps keep swelling down. It also gives support and helps with discomfort. If an elastic bandage has been applied, it should be removed and reapplied every 3 to 4 hours. It should not be applied tightly, but firmly enough to keep swelling down. Watch fingers or toes for swelling, bluish discoloration, coldness, numbness, or excessive pain. If any of these problems occur, remove the bandage and reapply loosely. Contact your caregiver if these problems continue.  Elevation helps reduce swelling and decreases pain. With extremities, such as the arms, hands, legs, and feet, the injured area should be placed near or above the level of the heart, if possible. SEEK IMMEDIATE MEDICAL CARE IF:  You have persistent pain and swelling.  You develop redness, numbness, or unexpected weakness.  Your symptoms are getting worse rather than improving after several days. These symptoms may indicate that further evaluation or further X-rays are needed. Sometimes, X-rays may not show a small broken bone (fracture) until 1 week or 10  days later. Make a follow-up appointment with your caregiver. Ask when your X-ray results will be ready. Make sure you get your X-ray results. Document Released: 10/05/2000 Document Revised: 06/28/2013 Document Reviewed: 11/22/2010 [facility] Patient Information 2015 [place]. This information is not intended to replace advice given to you by your health care provider. Make sure you discuss any questions you have with your health care provider.

## 2014-09-07 ENCOUNTER — Encounter: Payer: Self-pay | Admitting: Family Medicine

## 2014-09-07 ENCOUNTER — Ambulatory Visit (INDEPENDENT_AMBULATORY_CARE_PROVIDER_SITE_OTHER): Payer: Managed Care, Other (non HMO) | Admitting: Family Medicine

## 2014-09-07 VITALS — BP 156/91 | Ht 72.0 in | Wt 298.0 lb

## 2014-09-07 DIAGNOSIS — M7042 Prepatellar bursitis, left knee: Secondary | ICD-10-CM

## 2014-09-07 DIAGNOSIS — E119 Type 2 diabetes mellitus without complications: Secondary | ICD-10-CM

## 2014-09-07 MED ORDER — METHYLPREDNISOLONE ACETATE 40 MG/ML IJ SUSP
20.0000 mg | Freq: Once | INTRAMUSCULAR | Status: AC
  Administered 2014-09-07: 20 mg via INTRA_ARTICULAR

## 2014-09-07 NOTE — Patient Instructions (Signed)
You have prepatellar bursitis. We aspirated and injected this area today. Keep area wrapped as often as possible. Icing 15 minutes at a time at least 3-4 times a day. Elevate above the level of your heart when possible. Knee pad for any time you may be kneeling or can suffer a blow to the knee. Expect this to swell up some again but the cortisone part should help shrink it back down. Activities as tolerated otherwise. Follow up with me in 1 month.

## 2014-09-12 DIAGNOSIS — E119 Type 2 diabetes mellitus without complications: Secondary | ICD-10-CM | POA: Insufficient documentation

## 2014-09-12 DIAGNOSIS — M7042 Prepatellar bursitis, left knee: Secondary | ICD-10-CM | POA: Insufficient documentation

## 2014-09-12 NOTE — Progress Notes (Signed)
PCP: HASSAN,SAMI, MD  Subjective:   HPI: Patient is a 29 y.o. male here for left knee pain, swelling.  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 Has been doing more running, carrying, squatting, kneeling than usual though does not recall an acute injury. No catching, locking, giving out. + swelling that is worse by the end of the day and with activity. Has been using ACE wrap, biofreeze, icing. Knee feels more stiff. No issues right knee.  Past Medical History  Diagnosis Date  . Diabetes mellitus     Current Outpatient Prescriptions on File Prior to Visit  Medication Sig Dispense Refill  . Efinaconazole (JUBLIA) 10 % SOLN Apply 1 drop topically Nightly. 1 Bottle 3  . GREEN TEA, CAMILLIA SINENSIS, PO Take 1 tablet by mouth daily before lunch.    . naproxen (NAPROSYN) 500 MG tablet Take 1 tablet (500 mg total) by mouth 2 (two) times daily. 30 tablet 0   No current facility-administered medications on file prior to visit.    Past Surgical History  Procedure Laterality Date  . Femur fracture surgery      No Known Allergies  History   Social History  . Marital Status: Significant Other    Spouse Name: N/A  . Number of Children: N/A  . Years of Education: N/A   Occupational History  . Not on file.   Social History Main Topics  . Smoking status: Never Smoker   . Smokeless tobacco: Never Used  . Alcohol Use: No  . Drug Use: No  . Sexual Activity: Yes   Other Topics Concern  . Not on file   Social History Narrative    Family History  Problem Relation Age of Onset  . Diabetes Father   . Hyperlipidemia Father   . Hypertension Father   . Heart attack Father   . Obesity Father   . Diabetes Brother   . Diabetes Paternal Grandmother   . Diabetes Brother     BP 156/91 mmHg  Ht 6' (1.829 m)  Wt 298 lb (135.172 kg)  BMI 40.41 kg/m2  Review of Systems: See HPI above.    Objective:  Physical Exam:  Gen: NAD  Left  knee: Apparent prepatellar bursitis but no erythema. No tenderness of bursa or joint lines. FROM with stiffness on flexion. Negative ant/post drawers. Negative valgus/varus testing. Negative lachmanns. Negative mcmurrays, apleys, patellar apprehension. NV intact distally.    Assessment & Plan:  1. Left prepatellar bursitis - no fevers, chills.  No evidence of infection.  This is limiting him however and decreasing his mobility.  Likely from trauma when kneeling during training.  Aspirated and injected today.  ACE wrap or sleeve, icing, elevation when possible.  Activities as tolerated otherwise.  F/u in 1 month.  After informed written consent patient was seated on exam table.  Area overlying left prepatellar bursa prepped with alcohol swab then injected with 1mL marcaine for local anesthesia.  18mL sanguinous fluid then aspirated from left prepatellar bursa then injected with 0.5:0.665mL marcaine: depomedrol.  Patient tolerated procedure well without immediate complications.

## 2014-09-12 NOTE — Assessment & Plan Note (Signed)
no fevers, chills.  No evidence of infection.  This is limiting him however and decreasing his mobility.  Likely from trauma when kneeling during training.  Aspirated and injected today.  ACE wrap or sleeve, icing, elevation when possible.  Activities as tolerated otherwise.  F/u in 1 month.  After informed written consent patient was seated on exam table.  Area overlying left prepatellar bursa prepped with alcohol swab then injected with 1mL marcaine for local anesthesia.  18mL sanguinous fluid then aspirated from left prepatellar bursa then injected with 0.5:0.215mL marcaine: depomedrol.  Patient tolerated procedure well without immediate complications.

## 2014-09-21 ENCOUNTER — Encounter: Payer: Self-pay | Admitting: Family Medicine

## 2014-09-21 ENCOUNTER — Ambulatory Visit (INDEPENDENT_AMBULATORY_CARE_PROVIDER_SITE_OTHER): Payer: Managed Care, Other (non HMO) | Admitting: Family Medicine

## 2014-09-21 ENCOUNTER — Ambulatory Visit (HOSPITAL_BASED_OUTPATIENT_CLINIC_OR_DEPARTMENT_OTHER)
Admission: RE | Admit: 2014-09-21 | Discharge: 2014-09-21 | Disposition: A | Discharge status: Home / Self Care | Payer: Managed Care, Other (non HMO) | Source: Ambulatory Visit | Attending: Family Medicine | Admitting: Family Medicine

## 2014-09-21 VITALS — BP 153/84 | HR 75 | Ht 72.0 in | Wt 294.0 lb

## 2014-09-21 DIAGNOSIS — S86899A Other injury of other muscle(s) and tendon(s) at lower leg level, unspecified leg, initial encounter: Secondary | ICD-10-CM | POA: Diagnosis not present

## 2014-09-21 DIAGNOSIS — M25551 Pain in right hip: Secondary | ICD-10-CM

## 2014-09-21 DIAGNOSIS — M7042 Prepatellar bursitis, left knee: Secondary | ICD-10-CM | POA: Diagnosis not present

## 2014-09-21 NOTE — Patient Instructions (Addendum)
You have shin splints (medial tibial stress syndrome) Take tylenol and/or aleve as needed for pain. Icing 3-4 times a day and after activity for 15 minutes at a time Orthotics or shoe inserts may be helpful (something like dr. Jari Sportsmanscholls active series). Cross train with non-impact activities (cycling, swimming). Buy new shoes at least every 300 miles or yearly, whichever is sooner. Consider calf socks for compression.  Your have prepatellar bursitis of your left knee but I wouldn't recommend draining this again. Icing, medicines noted above, compression sleeve.  Your left hip pain is due to proximal IT band syndrome and a hip strain. Given the severity of your pain I would recommend no running for the next 2 weeks and I would consider not using stairs, inclines when possible. Follow up with me in 2 weeks for reevaluation.

## 2014-09-27 DIAGNOSIS — M25551 Pain in right hip: Secondary | ICD-10-CM | POA: Insufficient documentation

## 2014-09-27 DIAGNOSIS — S86899A Other injury of other muscle(s) and tendon(s) at lower leg level, unspecified leg, initial encounter: Secondary | ICD-10-CM | POA: Insufficient documentation

## 2014-09-27 NOTE — Progress Notes (Signed)
PCP: HASSAN,SAMI, MD  Subjective:   HPI: Patient is a 29 y.o. male here for left knee pain, swelling.  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 No catching, locking, giving out. + swelling that is worse by the end of the day and with activity. Has been using ACE wrap, biofreeze, icing. Knee feels more stiff. No issues right knee.  3/17: Patient returns with multiple complaints. Left knee still swollen, gets some sharp pains here at times. Both feet are swelling. Also having shin pain worse on the right side (though swollen more on the left). Having his worst pain lateral right hip with radiation some down the thigh. Pain level 8/10 in hip.  Past Medical History  Diagnosis Date  . Diabetes mellitus     Current Outpatient Prescriptions on File Prior to Visit  Medication Sig Dispense Refill  . Efinaconazole (JUBLIA) 10 % SOLN Apply 1 drop topically Nightly. 1 Bottle 3  . GREEN TEA, CAMILLIA SINENSIS, PO Take 1 tablet by mouth daily before lunch.    . metFORMIN (GLUCOPHAGE) 500 MG tablet Take 500 mg by mouth 2 (two) times daily with a meal.    . naproxen (NAPROSYN) 500 MG tablet Take 1 tablet (500 mg total) by mouth 2 (two) times daily. 30 tablet 0  . NAPROXEN DR 500 MG EC tablet   0  . PROAIR HFA 108 (90 BASE) MCG/ACT inhaler   1   No current facility-administered medications on file prior to visit.    Past Surgical History  Procedure Laterality Date  . Femur fracture surgery      No Known Allergies  History   Social History  . Marital Status: Significant Other    Spouse Name: N/A  . Number of Children: N/A  . Years of Education: N/A   Occupational History  . Not on file.   Social History Main Topics  . Smoking status: Never Smoker   . Smokeless tobacco: Never Used  . Alcohol Use: No  . Drug Use: No  . Sexual  Activity: Yes   Other Topics Concern  . Not on file   Social History Narrative    Family History  Problem Relation Age of Onset  . Diabetes Father   . Hyperlipidemia Father   . Hypertension Father   . Heart attack Father   . Obesity Father   . Diabetes Brother   . Diabetes Paternal Grandmother   . Diabetes Brother     BP 153/84 mmHg  Pulse 75  Ht 6' (1.829 m)  Wt 294 lb (133.358 kg)  BMI 39.86 kg/m2  Review of Systems: See HPI above.    Objective:  Physical Exam:  Gen: NAD  Left knee: Apparent prepatellar bursitis but no erythema. No tenderness of bursa or joint lines. FROM with stiffness on flexion. Negative ant/post drawers. Negative valgus/varus testing. Negative lachmanns. Negative mcmurrays, apleys, patellar apprehension. NV intact distally.  Bilateral lower legs: 1-2 + edema lower legs diffusely including ankles. TTP bilateral shins diffusely.  No ankle, foot tenderness. FROM ankles without pain. Strength 5/5 all motions of ankles and knees.  Left hip/back: No gross deformity. TTP lateral hip over proximal IT band.  No midline or bony TTP.  No other hip tenderness. FROM without pain on passive motion. Strength LEs 5/5 all muscle groups.   Negative SLRs. Sensation intact to light touch bilaterally. Negative logroll  bilateral hips Negative fabers and piriformis stretches. Mild pain with hop test.  MSK u/s:  No evidence stress fractures (neovascularity, edema, cortical irregularity) of bilateral tibias.    Assessment & Plan:  1. Bilateral shin splints - Reassured given exam and ultrasound.  Icing, tylenol/nsaids.  Discussed orthotics.  Calf socks for compression.  2. Right hip pain - Radiographs negative.  Exam benign other than proximal IT band tenderness.  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  Stress fracture is unlikely.  Written for no running next 2 weeks to rest given level of pain.  F/u in 2 weeks for a  recheck.  3. Left prepatellar bursitis - 2/2 trauma when kneeling in training.  Advised I would not repeat aspiration.  ACE wrap or sleeve, icing, elevation when possible.

## 2014-09-27 NOTE — Assessment & Plan Note (Signed)
2/2 trauma when kneeling in training.  Advised I would not repeat aspiration.  ACE wrap or sleeve, icing, elevation when possible.

## 2014-09-27 NOTE — Assessment & Plan Note (Signed)
Reassured given exam and ultrasound.  Icing, tylenol/nsaids.  Discussed orthotics.  Calf socks for compression.

## 2014-09-27 NOTE — Assessment & Plan Note (Signed)
Radiographs negative.  Exam benign other than proximal IT band tenderness.  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  Stress fracture is unlikely.  Written for no running next 2 weeks to rest given level of pain.  F/u in 2 weeks for a recheck.

## 2014-10-13 ENCOUNTER — Encounter: Payer: Self-pay | Admitting: Family Medicine

## 2014-10-13 ENCOUNTER — Ambulatory Visit (INDEPENDENT_AMBULATORY_CARE_PROVIDER_SITE_OTHER): Payer: Managed Care, Other (non HMO) | Admitting: Family Medicine

## 2014-10-13 VITALS — BP 147/97 | HR 59 | Ht 72.0 in | Wt 287.0 lb

## 2014-10-13 DIAGNOSIS — M79674 Pain in right toe(s): Secondary | ICD-10-CM

## 2014-10-13 MED ORDER — PREDNISONE (PAK) 10 MG PO TABS: ORAL_TABLET | ORAL | Status: DC

## 2014-10-13 NOTE — Patient Instructions (Signed)
You're cleared fully from all medical issues - no restrictions at this time. Your right great toe pain is likely due to gout. Take prednisone dose pack x 6 days as directed. Day after you finish this you can go back to taking 2 aleve twice a day with food until the pain has resolved. Icing 15 minutes at a time 3-4 times a day. Buddy tape your toes until pain has resolved too. Typically these take 2-4 weeks to get better without treatment - quicker with the above measures. Call me if you have any problems.

## 2014-10-17 DIAGNOSIS — M79674 Pain in right toe(s): Secondary | ICD-10-CM | POA: Insufficient documentation

## 2014-10-17 NOTE — Progress Notes (Signed)
PCP: HASSAN,SAMI, MD  Subjective:   HPI: Patient is a 29 y.o. male here for right foot pain.  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 No catching, locking, giving out. + swelling that is worse by the end of the day and with activity. Has been using ACE wrap, biofreeze, icing. Knee feels more stiff. No issues right knee.  3/17: Patient returns with multiple complaints. Left knee still swollen, gets some sharp pains here at times. Both feet are swelling. Also having shin pain worse on the right side (though swollen more on the left). Having his worst pain lateral right hip with radiation some down the thigh. Pain level 8/10 in hip.  4/8: Patient reports his right hip, left knee are improved though left knee still has a little swelling. Right foot is sore at base of great toe. Has been elevating, icing, taking aleve. Has been worse past week. Pain up to 5/10 - worse with walking, running.  Past Medical History  Diagnosis Date  . Diabetes mellitus     Current Outpatient Prescriptions on File Prior to Visit  Medication Sig Dispense Refill  . Efinaconazole (JUBLIA) 10 % SOLN Apply 1 drop topically Nightly. 1 Bottle 3  . GREEN TEA, CAMILLIA SINENSIS, PO Take 1 tablet by mouth daily before lunch.    . metFORMIN (GLUCOPHAGE) 500 MG tablet Take 500 mg by mouth 2 (two) times daily with a meal.    . naproxen (NAPROSYN) 500 MG tablet Take 1 tablet (500 mg total) by mouth 2 (two) times daily. 30 tablet 0  . NAPROXEN DR 500 MG EC tablet   0  . PROAIR HFA 108 (90 BASE) MCG/ACT inhaler   1   No current facility-administered medications on file prior to visit.    Past Surgical History  Procedure Laterality Date  . Femur fracture surgery      No Known Allergies  History   Social History  . Marital Status: Significant Other    Spouse  Name: N/A  . Number of Children: N/A  . Years of Education: N/A   Occupational History  . Not on file.   Social History Main Topics  . Smoking status: Never Smoker   . Smokeless tobacco: Never Used  . Alcohol Use: No  . Drug Use: No  . Sexual Activity: Yes   Other Topics Concern  . Not on file   Social History Narrative    Family History  Problem Relation Age of Onset  . Diabetes Father   . Hyperlipidemia Father   . Hypertension Father   . Heart attack Father   . Obesity Father   . Diabetes Brother   . Diabetes Paternal Grandmother   . Diabetes Brother     BP 147/97 mmHg  Pulse 59  Ht 6' (1.829 m)  Wt 287 lb (130.182 kg)  BMI 38.92 kg/m2  Review of Systems: See HPI above.    Objective:  Physical Exam:  Gen: NAD  Right foot: No gross deformity, erythema, bruising. Mild-mod limitation extension of 1st MTP.   TTP around 1st MTP. NVI distally with <2 sec cap refill.  Assessment & Plan:  1. Right 1st MTP pain - consistent with acute gout flare.  No injury to suggest fracture or turf toe.  Flare of DJD a possibility.  Will start with prednisone dose pack (discussed increased blood surgars), icing, buddy  taping.  Expect 2-4 weeks at most to resolve.  Cleared for all activities though without restrictions.

## 2014-10-17 NOTE — Assessment & Plan Note (Signed)
consistent with acute gout flare.  No injury to suggest fracture or turf toe.  Flare of DJD a possibility.  Will start with prednisone dose pack (discussed increased blood surgars), icing, buddy taping.  Expect 2-4 weeks at most to resolve.  Cleared for all activities though without restrictions.

## 2020-04-06 ENCOUNTER — Ambulatory Visit (HOSPITAL_COMMUNITY)
Admission: EM | Admit: 2020-04-06 | Discharge: 2020-04-06 | Disposition: A | Discharge status: Home / Self Care | Payer: Managed Care, Other (non HMO) | Attending: Family Medicine | Admitting: Family Medicine

## 2020-04-06 ENCOUNTER — Encounter (HOSPITAL_COMMUNITY): Payer: Self-pay | Admitting: Emergency Medicine

## 2020-04-06 ENCOUNTER — Emergency Department (HOSPITAL_COMMUNITY): Payer: Managed Care, Other (non HMO)

## 2020-04-06 ENCOUNTER — Encounter (HOSPITAL_COMMUNITY): Payer: Self-pay

## 2020-04-06 ENCOUNTER — Other Ambulatory Visit: Payer: Self-pay

## 2020-04-06 ENCOUNTER — Emergency Department (HOSPITAL_COMMUNITY)
Admission: EM | Admit: 2020-04-06 | Discharge: 2020-04-07 | Disposition: A | Discharge status: Home / Self Care | Payer: Managed Care, Other (non HMO) | Attending: Emergency Medicine | Admitting: Emergency Medicine

## 2020-04-06 DIAGNOSIS — R109 Unspecified abdominal pain: Secondary | ICD-10-CM | POA: Diagnosis present

## 2020-04-06 DIAGNOSIS — R112 Nausea with vomiting, unspecified: Secondary | ICD-10-CM

## 2020-04-06 DIAGNOSIS — R1010 Upper abdominal pain, unspecified: Secondary | ICD-10-CM

## 2020-04-06 DIAGNOSIS — Z7984 Long term (current) use of oral hypoglycemic drugs: Secondary | ICD-10-CM | POA: Insufficient documentation

## 2020-04-06 DIAGNOSIS — E119 Type 2 diabetes mellitus without complications: Secondary | ICD-10-CM | POA: Insufficient documentation

## 2020-04-06 LAB — URINALYSIS, ROUTINE W REFLEX MICROSCOPIC
Bacteria, UA: NONE SEEN
Bilirubin Urine: NEGATIVE
Glucose, UA: >=500 mg/dL — AB
Hgb urine dipstick: NEGATIVE
Ketones, ur: 80 mg/dL — AB
Leukocytes,Ua: NEGATIVE
Nitrite: NEGATIVE
Protein, ur: NEGATIVE mg/dL
Specific Gravity, Urine: 1.026 (ref 1.005–1.030)
pH: 8 (ref 5.0–8.0)

## 2020-04-06 LAB — COMPREHENSIVE METABOLIC PANEL
ALT: 15 U/L (ref 0–44)
AST: 15 U/L (ref 15–41)
Albumin: 3.9 g/dL (ref 3.5–5.0)
Alkaline Phosphatase: 83 U/L (ref 38–126)
Anion gap: 14 (ref 5–15)
BUN: 7 mg/dL (ref 6–20)
CO2: 22 mmol/L (ref 22–32)
Calcium: 9.4 mg/dL (ref 8.9–10.3)
Chloride: 100 mmol/L (ref 98–111)
Creatinine, Ser: 0.85 mg/dL (ref 0.61–1.24)
GFR calc Af Amer: >60 mL/min (ref >60)
GFR calc non Af Amer: >60 mL/min (ref >60)
Glucose, Bld: 316 mg/dL — ABNORMAL HIGH (ref 70–99)
Potassium: 4 mmol/L (ref 3.5–5.1)
Sodium: 136 mmol/L (ref 135–145)
Total Bilirubin: 3.4 mg/dL — ABNORMAL HIGH (ref 0.3–1.2)
Total Protein: 7.4 g/dL (ref 6.5–8.1)

## 2020-04-06 LAB — CBC
HCT: 43.6 % (ref 39.0–52.0)
Hemoglobin: 15.2 g/dL (ref 13.0–17.0)
MCH: 27.3 pg (ref 26.0–34.0)
MCHC: 34.9 g/dL (ref 30.0–36.0)
MCV: 78.3 fL — ABNORMAL LOW (ref 80.0–100.0)
Platelets: 290 10*3/uL (ref 150–400)
RBC: 5.57 MIL/uL (ref 4.22–5.81)
RDW: 11.9 % (ref 11.5–15.5)
WBC: 5.9 10*3/uL (ref 4.0–10.5)
nRBC: 0 % (ref 0.0–0.2)

## 2020-04-06 LAB — LIPASE, BLOOD: Lipase: 28 U/L (ref 11–51)

## 2020-04-06 MED ORDER — IOHEXOL 300 MG/ML  SOLN
100.0000 mL | Freq: Once | INTRAMUSCULAR | Status: AC | PRN
  Administered 2020-04-06: 100 mL via INTRAVENOUS

## 2020-04-06 MED ORDER — ONDANSETRON 4 MG PO TBDP
ORAL_TABLET | ORAL | Status: AC
  Filled 2020-04-06: qty 1

## 2020-04-06 MED ORDER — ONDANSETRON 4 MG PO TBDP
4.0000 mg | ORAL_TABLET | Freq: Once | ORAL | Status: AC
  Administered 2020-04-06: 4 mg via ORAL
  Filled 2020-04-06: qty 1

## 2020-04-06 MED ORDER — HYDROCODONE-ACETAMINOPHEN 5-325 MG PO TABS
1.0000 | ORAL_TABLET | Freq: Once | ORAL | Status: AC
  Administered 2020-04-06: 1 via ORAL
  Filled 2020-04-06: qty 1

## 2020-04-06 MED ORDER — ONDANSETRON 4 MG PO TBDP
4.0000 mg | ORAL_TABLET | Freq: Once | ORAL | Status: AC
  Administered 2020-04-06: 4 mg via ORAL

## 2020-04-06 NOTE — ED Triage Notes (Signed)
Pt presents with severe n/v, abd cramping x 7 days. Pt reports symptoms began after eating fish 7 days ago. Pt sent to ED for CT after evaluation at [facility]. Emesis x 6 in last 24 hours.

## 2020-04-06 NOTE — ED Triage Notes (Signed)
Pt present abdominal pain with vomiting. Last Friday patient ate some fish and afterwards he started having cramping. Pt started vomiting today.

## 2020-04-07 LAB — CBG MONITORING, ED: Glucose-Capillary: 312 mg/dL — ABNORMAL HIGH (ref 70–99)

## 2020-04-07 MED ORDER — ALUM & MAG HYDROXIDE-SIMETH 200-200-20 MG/5ML PO SUSP
30.0000 mL | Freq: Once | ORAL | Status: AC
  Administered 2020-04-07: 30 mL via ORAL
  Filled 2020-04-07: qty 30

## 2020-04-07 MED ORDER — SODIUM CHLORIDE 0.9 % IV BOLUS
500.0000 mL | Freq: Once | INTRAVENOUS | Status: AC
  Administered 2020-04-07: 500 mL via INTRAVENOUS

## 2020-04-07 MED ORDER — SUCRALFATE 1 GM/10ML PO SUSP
1.0000 g | Freq: Once | ORAL | Status: AC
Start: 2020-04-07 — End: 2020-04-07
  Administered 2020-04-07: 1 g via ORAL
  Filled 2020-04-07: qty 10

## 2020-04-07 MED ORDER — FAMOTIDINE 20 MG PO TABS: 20.0000 mg | ORAL_TABLET | Freq: Two times a day (BID) | ORAL | 0 refills | Status: AC

## 2020-04-07 MED ORDER — ONDANSETRON HCL 4 MG PO TABS: 4.0000 mg | ORAL_TABLET | Freq: Four times a day (QID) | ORAL | 0 refills | Status: DC

## 2020-04-07 MED ORDER — FAMOTIDINE 20 MG PO TABS
20.0000 mg | ORAL_TABLET | Freq: Once | ORAL | Status: AC
  Administered 2020-04-07: 20 mg via ORAL
  Filled 2020-04-07: qty 1

## 2020-04-07 MED ORDER — LIDOCAINE VISCOUS HCL 2 % MT SOLN
15.0000 mL | Freq: Once | OROMUCOSAL | Status: AC
  Administered 2020-04-07: 15 mL via ORAL
  Filled 2020-04-07: qty 15

## 2020-04-07 MED ORDER — SUCRALFATE 1 GM/10ML PO SUSP: 1.0000 g | Freq: Three times a day (TID) | ORAL | 0 refills | Status: DC

## 2020-04-07 MED ORDER — ONDANSETRON 4 MG PO TBDP
4.0000 mg | ORAL_TABLET | Freq: Once | ORAL | Status: AC
  Administered 2020-04-07: 4 mg via ORAL
  Filled 2020-04-07: qty 1

## 2020-04-07 NOTE — Discharge Instructions (Signed)
Take medication as directed.   Please follow up with your primary doctor within the next 5-7 days.  If you do not have a primary care provider, information for a healthcare clinic has been provided for you to make arrangements for follow up care. Please return to the ER sooner if you have any new or worsening symptoms, or if you have any of the following symptoms:  Abdominal pain that does not go away.  You have a fever.  You keep throwing up (vomiting).  The pain is felt only in portions of the abdomen. Pain in the right side could possibly be appendicitis. In an adult, pain in the left lower portion of the abdomen could be colitis or diverticulitis.  You pass bloody or black tarry stools.  There is bright red blood in the stool.  The constipation stays for more than 4 days.  There is belly (abdominal) or rectal pain.  You do not seem to be getting better.  You have any questions or concerns.   

## 2020-04-07 NOTE — ED Provider Notes (Signed)
[facility] EMERGENCY DEPARTMENT Provider Note   CSN: [id number] Arrival date & time: 04/06/20  2024     History Chief Complaint  Patient presents with  . Abdominal Pain    Luis Orozco is a 34 y.o. male.  HPI   Pt is a 34 y/o male with a h/o DM, who presents to the ED today for eval of abd pain. States that pain started intermittently 1 week ago after eating some store bought fish. Describes is as a cramping pain that has worsened since onset.  He reports associated nausea, vomiting. Denies diarrhea, constipation, urinary sxs.  Last BM was 2 days ago.   Past Medical History:  Diagnosis Date  . Diabetes mellitus     Patient Active Problem List   Diagnosis Date Noted  . Toe pain, right 10/17/2014  . Shin splints 09/27/2014  . Right hip pain 09/27/2014  . Diabetes (HCC) 09/12/2014  . Prepatellar bursitis of left knee 09/12/2014  . Bunion 04/13/2013  . Onychomycosis 04/13/2013  . Metatarsal deformity 04/13/2013    Past Surgical History:  Procedure Laterality Date  . FEMUR FRACTURE SURGERY         Family History  Problem Relation Age of Onset  . Diabetes Father   . Hyperlipidemia Father   . Hypertension Father   . Heart attack Father   . Obesity Father   . Diabetes Brother   . Diabetes Paternal Grandmother   . Diabetes Brother     Social History   Tobacco Use  . Smoking status: Never Smoker  . Smokeless tobacco: Never Used  Substance Use Topics  . Alcohol use: No    Alcohol/week: 0.0 standard drinks  . Drug use: No    Home Medications Prior to Admission medications   Medication Sig Start Date End Date Taking? Authorizing Provider  Efinaconazole (JUBLIA) 10 % SOLN Apply 1 drop topically Nightly. 04/13/13   Sheard, Myeong O, DPM  famotidine (PEPCID) 20 MG tablet Take 1 tablet (20 mg total) by mouth 2 (two) times daily for 14 days. 04/07/20 04/21/20  Able Malloy S, PA-C  GREEN TEA, CAMILLIA SINENSIS, PO Take 1 tablet by mouth  daily before lunch.    [provider]  metFORMIN (GLUCOPHAGE) 500 MG tablet Take 500 mg by mouth 2 (two) times daily with a meal.    [provider]  naproxen (NAPROSYN) 500 MG tablet Take 1 tablet (500 mg total) by mouth 2 (two) times daily. 09/06/14   Hess, Nada Boozer, PA-C  NAPROXEN DR 500 MG EC tablet  09/02/14   [provider]  ondansetron (ZOFRAN) 4 MG tablet Take 1 tablet (4 mg total) by mouth every 6 (six) hours. 04/07/20   Cleotis Sparr S, PA-C  predniSONE (STERAPRED UNI-PAK) 10 MG tablet 6 tabs po day 1, 5 tabs po day 2, 4 tabs po day 3, 3 tabs po day 4, 2 tabs po day 5, 1 tab po day 6 10/13/14   Hudnall, Azucena Fallen, MD  PROAIR HFA 108 (90 BASE) MCG/ACT inhaler  08/14/14   [provider]  sucralfate (CARAFATE) 1 GM/10ML suspension Take 10 mLs (1 g total) by mouth 4 (four) times daily -  with meals and at bedtime. 04/07/20   Maximino Cozzolino S, PA-C    Allergies    Patient has no known allergies.  Review of Systems   Review of Systems  Constitutional: Negative for chills and fever.  HENT: Negative for ear pain and sore throat.  Eyes: Negative for pain and visual disturbance.  Respiratory: Negative for cough and shortness of breath.   Cardiovascular: Negative for chest pain.  Gastrointestinal: Positive for abdominal pain, nausea and vomiting. Negative for blood in stool, constipation and diarrhea.  Genitourinary: Negative for dysuria and hematuria.  Musculoskeletal: Negative for back pain.  Skin: Negative for rash.  Neurological: Negative for headaches.  All other systems reviewed and are negative.   Physical Exam Updated Vital Signs BP 138/78   Pulse 72   Temp 99 F (37.2 C) (Oral)   Resp 16   SpO2 100%   Physical Exam Vitals and nursing note reviewed.  Constitutional:      Appearance: He is well-developed.  HENT:     Head: Normocephalic and atraumatic.  Eyes:     Conjunctiva/sclera: Conjunctivae normal.  Cardiovascular:     Rate and  Rhythm: Normal rate and regular rhythm.     Heart sounds: Normal heart sounds. No murmur heard.   Pulmonary:     Effort: Pulmonary effort is normal. No respiratory distress.     Breath sounds: Normal breath sounds. No wheezing, rhonchi or rales.  Abdominal:     General: Bowel sounds are normal.     Palpations: Abdomen is soft.     Tenderness: There is abdominal tenderness in the epigastric area. There is no guarding or rebound.  Musculoskeletal:     Cervical back: Neck supple.  Skin:    General: Skin is warm and dry.  Neurological:     Mental Status: He is alert.     ED Results / Procedures / Treatments   Labs (all labs ordered are listed, but only abnormal results are displayed) Labs Reviewed  COMPREHENSIVE METABOLIC PANEL - Abnormal; Notable for the following components:      Result Value   Glucose, Bld 316 (*)    Total Bilirubin 3.4 (*)    All other components within normal limits  CBC - Abnormal; Notable for the following components:   MCV 78.3 (*)    All other components within normal limits  URINALYSIS, ROUTINE W REFLEX MICROSCOPIC - Abnormal; Notable for the following components:   APPearance HAZY (*)    Glucose, UA >=500 (*)    Ketones, ur 80 (*)    All other components within normal limits  CBG MONITORING, ED - Abnormal; Notable for the following components:   Glucose-Capillary 312 (*)    All other components within normal limits  LIPASE, BLOOD    EKG None  Radiology CT ABDOMEN PELVIS W CONTRAST  Result Date: 04/06/2020 CLINICAL DATA:  Pt present abdominal pain with vomiting. Last Friday patient ate some fish and afterwards he started having cramping. Pt started vomiting today EXAM: CT ABDOMEN AND PELVIS WITH CONTRAST TECHNIQUE: Multidetector CT imaging of the abdomen and pelvis was performed using the standard protocol following bolus administration of intravenous contrast. CONTRAST:  OMNIPAQUE IOHEXOL 300 MG/ML  SOLN COMPARISON:  None. FINDINGS: Lower  chest: No acute abnormality. Hepatobiliary: Subcentimeter hypodensity within the left hepatic lobe is small to characterize. Otherwise no focal liver abnormality is seen. No gallstones, gallbladder wall thickening, or biliary dilatation. Pancreas: Unremarkable. No pancreatic ductal dilatation or surrounding inflammatory changes. Spleen: Normal in size without focal abnormality. Adrenals/Urinary Tract: No adrenal nodule bilaterally. Bilateral kidneys enhance symmetrically. Punctate nephrolithiasis within the inferior pole of the right kidney open (3:41). Total of 2 calcified stones are noted within the left kidney measuring 0.5 and 0.2 cm (3:39). Subcentimeter hypodensity along the inferior pole  of the left kidney is too small to characterize. No hydronephrosis. No hydroureter. The urinary bladder is unremarkable. Stomach/Bowel: Suggestion of a gastric fundus diverticulum open (6:45, 3:16). Stomach is within normal limits. Appendix appears normal. No evidence of bowel wall thickening, distention, or inflammatory changes. Few scattered colonic diverticula Vascular/Lymphatic: Several phleboliths identified within the pelvis. No abdominal aorta or iliac aneurysm.No abdominal, pelvic, or inguinal lymphadenopathy. Reproductive: Prostate is unremarkable. Other: No intraperitoneal free fluid. No intraperitoneal free gas. No organized fluid collection. Musculoskeletal: No acute or significant osseous findings. Partially visualized fixation of the proximal left femur. IMPRESSION: 1. Suggestion of gastric fundus diverticulum. 2. Nonobstructive bilateral nephrolithiasis (punctate on the right, measuring up to 5 mm on the left). 3. Scattered colonic diverticula with no acute diverticulitis. Electronically Signed   By: Tish FredericksonMorgane  Naveau M.D.   On: 04/06/2020 23:45    Procedures Procedures (including critical care time)  Medications Ordered in ED Medications  ondansetron (ZOFRAN-ODT) disintegrating tablet 4 mg (4 mg Oral  Given 04/06/20 2142)  HYDROcodone-acetaminophen (NORCO/VICODIN) 5-325 MG per tablet 1 tablet (1 tablet Oral Given 04/06/20 2142)  iohexol (OMNIPAQUE) 300 MG/ML solution 100 mL (100 mLs Intravenous Contrast Given 04/06/20 2321)  alum & mag hydroxide-simeth (MAALOX/MYLANTA) 200-200-20 MG/5ML suspension 30 mL (30 mLs Oral Given 04/07/20 0932)    And  lidocaine (XYLOCAINE) 2 % viscous mouth solution 15 mL (15 mLs Oral Given 04/07/20 0932)  sucralfate (CARAFATE) 1 GM/10ML suspension 1 g (1 g Oral Given 04/07/20 1008)  famotidine (PEPCID) tablet 20 mg (20 mg Oral Given 04/07/20 0932)  ondansetron (ZOFRAN-ODT) disintegrating tablet 4 mg (4 mg Oral Given 04/07/20 0932)  sodium chloride 0.9 % bolus 500 mL (500 mLs Intravenous New Bag/Given 04/07/20 0932)    ED Course  I have reviewed the triage vital signs and the nursing notes.  Pertinent labs & imaging results that were available during my care of the patient were reviewed by me and considered in my medical decision making (see chart for details).    MDM Rules/Calculators/A&P                          34 y/o male presenting for eval of abd pain. Epigastric ttp on exam but abd nonsurgical  CBC w/o leukocytosis or anemia CMP with normal electrolytes, kidney function and LFTs. BS elevated but normal bicarb and negative anion gap.  Lipase negative UA neg for UTI. Some ketones noted.   CT abd/pelvis - 1. Suggestion of gastric fundus diverticulum. 2. Nonobstructive bilateral nephrolithiasis (punctate on the right, measuring up to 5 mm on the left). 3. Scattered colonic diverticula with no acute diverticulitis.   Patient was given IV fluids, Zofran, GI cocktail, Carafate and Pepcid.  On reassessment he states he is feeling improved after medications.  He was p.o. challenged and able to tolerate p.o. in the emergency department.  I suspect his symptoms may be related to gastritis and would also consider gastroparesis given his history of diabetes.  Will DC him  with Zofran, Carafate, and Pepcid for home.  Will refer him to gastroenterology for further evaluation.  Have advised on specific return precautions.  He voices understanding of the plan and reasons to return.  All questions answered.  Patient stable for discharge.   Final Clinical Impression(s) / ED Diagnoses Final diagnoses:  Abdominal pain, unspecified abdominal location    Rx / DC Orders ED Discharge Orders         Ordered  ondansetron (ZOFRAN) 4 MG tablet  Every 6 hours        04/07/20 1041    sucralfate (CARAFATE) 1 GM/10ML suspension  3 times daily with meals & bedtime        04/07/20 1041    famotidine (PEPCID) 20 MG tablet  2 times daily        04/07/20 [address], [place], PA-C 04/07/20 1125    Gerhard Munch, MD 04/08/20 [phone]

## 2020-04-08 ENCOUNTER — Telehealth: Payer: Self-pay

## 2020-04-08 NOTE — Telephone Encounter (Signed)
Patient called to state only one prescription came through to his pharmacy, and that was the Pepcid. Called his pharmacy, [facility] to verify , and call in the other two prescriptions he was missing.

## 2020-04-09 ENCOUNTER — Encounter (HOSPITAL_BASED_OUTPATIENT_CLINIC_OR_DEPARTMENT_OTHER): Payer: Self-pay | Admitting: *Deleted

## 2020-04-09 ENCOUNTER — Emergency Department (HOSPITAL_BASED_OUTPATIENT_CLINIC_OR_DEPARTMENT_OTHER)
Admission: EM | Admit: 2020-04-09 | Discharge: 2020-04-10 | Disposition: A | Discharge status: Home / Self Care | Payer: Managed Care, Other (non HMO) | Attending: Emergency Medicine | Admitting: Emergency Medicine

## 2020-04-09 ENCOUNTER — Other Ambulatory Visit: Payer: Self-pay

## 2020-04-09 DIAGNOSIS — Z7984 Long term (current) use of oral hypoglycemic drugs: Secondary | ICD-10-CM | POA: Insufficient documentation

## 2020-04-09 DIAGNOSIS — R112 Nausea with vomiting, unspecified: Secondary | ICD-10-CM | POA: Insufficient documentation

## 2020-04-09 DIAGNOSIS — E119 Type 2 diabetes mellitus without complications: Secondary | ICD-10-CM | POA: Insufficient documentation

## 2020-04-09 DIAGNOSIS — R109 Unspecified abdominal pain: Secondary | ICD-10-CM | POA: Insufficient documentation

## 2020-04-09 LAB — CBC WITH DIFFERENTIAL/PLATELET
Abs Immature Granulocytes: 0.03 10*3/uL (ref 0.00–0.07)
Basophils Absolute: 0 10*3/uL (ref 0.0–0.1)
Basophils Relative: 1 %
Eosinophils Absolute: 0.1 10*3/uL (ref 0.0–0.5)
Eosinophils Relative: 1 %
HCT: 44.4 % (ref 39.0–52.0)
Hemoglobin: 15.6 g/dL (ref 13.0–17.0)
Immature Granulocytes: 1 %
Lymphocytes Relative: 33 %
Lymphs Abs: 2.1 10*3/uL (ref 0.7–4.0)
MCH: 27.9 pg (ref 26.0–34.0)
MCHC: 35.1 g/dL (ref 30.0–36.0)
MCV: 79.4 fL — ABNORMAL LOW (ref 80.0–100.0)
Monocytes Absolute: 0.5 10*3/uL (ref 0.1–1.0)
Monocytes Relative: 8 %
Neutro Abs: 3.6 10*3/uL (ref 1.7–7.7)
Neutrophils Relative %: 56 %
Platelets: 289 10*3/uL (ref 150–400)
RBC: 5.59 MIL/uL (ref 4.22–5.81)
RDW: 11.9 % (ref 11.5–15.5)
WBC: 6.3 10*3/uL (ref 4.0–10.5)
nRBC: 0 % (ref 0.0–0.2)

## 2020-04-09 LAB — URINALYSIS, MICROSCOPIC (REFLEX)
RBC / HPF: NONE SEEN RBC/hpf (ref 0–5)
WBC, UA: NONE SEEN WBC/hpf (ref 0–5)

## 2020-04-09 LAB — COMPREHENSIVE METABOLIC PANEL
ALT: 13 U/L (ref 0–44)
AST: 12 U/L — ABNORMAL LOW (ref 15–41)
Albumin: 3.8 g/dL (ref 3.5–5.0)
Alkaline Phosphatase: 74 U/L (ref 38–126)
Anion gap: 9 (ref 5–15)
BUN: 13 mg/dL (ref 6–20)
CO2: 26 mmol/L (ref 22–32)
Calcium: 9.1 mg/dL (ref 8.9–10.3)
Chloride: 97 mmol/L — ABNORMAL LOW (ref 98–111)
Creatinine, Ser: 0.92 mg/dL (ref 0.61–1.24)
GFR calc Af Amer: >60 mL/min (ref >60)
GFR calc non Af Amer: >60 mL/min (ref >60)
Glucose, Bld: 337 mg/dL — ABNORMAL HIGH (ref 70–99)
Potassium: 3.9 mmol/L (ref 3.5–5.1)
Sodium: 132 mmol/L — ABNORMAL LOW (ref 135–145)
Total Bilirubin: 3.2 mg/dL — ABNORMAL HIGH (ref 0.3–1.2)
Total Protein: 7.3 g/dL (ref 6.5–8.1)

## 2020-04-09 LAB — URINALYSIS, ROUTINE W REFLEX MICROSCOPIC
Bilirubin Urine: NEGATIVE
Glucose, UA: >=500 mg/dL — AB
Hgb urine dipstick: NEGATIVE
Ketones, ur: 15 mg/dL — AB
Leukocytes,Ua: NEGATIVE
Nitrite: NEGATIVE
Protein, ur: NEGATIVE mg/dL
Specific Gravity, Urine: 1.02 (ref 1.005–1.030)
pH: 6 (ref 5.0–8.0)

## 2020-04-09 LAB — CBG MONITORING, ED: Glucose-Capillary: 242 mg/dL — ABNORMAL HIGH (ref 70–99)

## 2020-04-09 LAB — LIPASE, BLOOD: Lipase: 39 U/L (ref 11–51)

## 2020-04-09 MED ORDER — DICYCLOMINE HCL 10 MG/ML IM SOLN
20.0000 mg | Freq: Once | INTRAMUSCULAR | Status: AC
  Administered 2020-04-09: 20 mg via INTRAMUSCULAR
  Filled 2020-04-09: qty 2

## 2020-04-09 MED ORDER — FAMOTIDINE IN NACL 20-0.9 MG/50ML-% IV SOLN
20.0000 mg | Freq: Once | INTRAVENOUS | Status: AC
  Administered 2020-04-09: 20 mg via INTRAVENOUS
  Filled 2020-04-09: qty 50

## 2020-04-09 MED ORDER — ONDANSETRON 4 MG PO TBDP: ORAL_TABLET | ORAL | 0 refills | Status: DC

## 2020-04-09 MED ORDER — DICYCLOMINE HCL 20 MG PO TABS: 20.0000 mg | ORAL_TABLET | Freq: Two times a day (BID) | ORAL | 0 refills | Status: DC

## 2020-04-09 MED ORDER — ONDANSETRON HCL 4 MG/2ML IJ SOLN
4.0000 mg | Freq: Once | INTRAMUSCULAR | Status: AC
  Administered 2020-04-09: 4 mg via INTRAVENOUS
  Filled 2020-04-09: qty 2

## 2020-04-09 MED ORDER — SODIUM CHLORIDE 0.9 % IV BOLUS
2000.0000 mL | Freq: Once | INTRAVENOUS | Status: AC
  Administered 2020-04-09: 1000 mL via INTRAVENOUS

## 2020-04-09 NOTE — Discharge Instructions (Addendum)
Use Zofran and Bentyl in addition to the other medication she was prescribed on Friday to help manage her symptoms. Please call to schedule follow-up appointment with GI, use the phone number on your paperwork from Friday. Try to eat small frequent meals with bland foods for the next few days until your symptoms improve.

## 2020-04-09 NOTE — ED Provider Notes (Signed)
[facility] EMERGENCY DEPARTMENT Provider Note   CSN: [id number] Arrival date & time: 04/09/20  1316     History Chief Complaint  Patient presents with  . Emesis  . Abdominal Pain    Luis Orozco is a 34 y.o. male.  [name] is a 34 y.o. male with a history of diabetes, who presents to the emergency department for continued abdominal cramping, nausea and vomiting.  Patient was seen for similar symptoms in the ED on 10/1 after he states that a week prior he had bought some fish from the store to eat neck, states the fish tasted okay but afterwards he developed abdominal cramping, nausea and vomiting.  In the ED he had a reassuring work-up, CT showed a gastric diverticulum but no other acute findings and lab work was overall reassuring.  He was treated with Zofran, Pepcid and Carafate and states that initially his symptoms were improving, but he has started to have nausea and vomiting once again and is not able to keep much down and it has intermittent abdominal cramping that is severe and very uncomfortable.  Denies diarrhea, constipation or blood in the stool.  No new urinary symptoms.  No fevers or chills.  No chest pain or shortness of breath.  No other foods out of the ordinary.  During recent ED visit patient was referred to GI but he states he has not called to schedule follow-up appointment yet.        Past Medical History:  Diagnosis Date  . Diabetes mellitus     Patient Active Problem List   Diagnosis Date Noted  . Toe pain, right 10/17/2014  . Shin splints 09/27/2014  . Right hip pain 09/27/2014  . Diabetes (HCC) 09/12/2014  . Prepatellar bursitis of left knee 09/12/2014  . Bunion 04/13/2013  . Onychomycosis 04/13/2013  . Metatarsal deformity 04/13/2013    Past Surgical History:  Procedure Laterality Date  . FEMUR FRACTURE SURGERY         Family History  Problem Relation Age of Onset  . Diabetes Father   . Hyperlipidemia Father   .  Hypertension Father   . Heart attack Father   . Obesity Father   . Diabetes Brother   . Diabetes Paternal Grandmother   . Diabetes Brother     Social History   Tobacco Use  . Smoking status: Never Smoker  . Smokeless tobacco: Never Used  Substance Use Topics  . Alcohol use: No    Alcohol/week: 0.0 standard drinks  . Drug use: No    Home Medications Prior to Admission medications   Medication Sig Start Date End Date Taking? Authorizing Provider  dicyclomine (BENTYL) 20 MG tablet Take 1 tablet (20 mg total) by mouth 2 (two) times daily. 04/09/20   Dartha Lodge, PA-C  Efinaconazole (JUBLIA) 10 % SOLN Apply 1 drop topically Nightly. 04/13/13   Sheard, Myeong O, DPM  famotidine (PEPCID) 20 MG tablet Take 1 tablet (20 mg total) by mouth 2 (two) times daily for 14 days. 04/07/20 04/21/20  Couture, Cortni S, PA-C  GREEN TEA, CAMILLIA SINENSIS, PO Take 1 tablet by mouth daily before lunch.    [provider]  metFORMIN (GLUCOPHAGE) 500 MG tablet Take 500 mg by mouth 2 (two) times daily with a meal.    [provider]  naproxen (NAPROSYN) 500 MG tablet Take 1 tablet (500 mg total) by mouth 2 (two) times daily. 09/06/14   Hess, Nada Boozer, PA-C  NAPROXEN DR 500 MG  EC tablet  09/02/14   [provider]  ondansetron (ZOFRAN ODT) 4 MG disintegrating tablet 4mg  ODT q4 hours prn nausea/vomit 04/09/20   06/09/20, PA-C  ondansetron (ZOFRAN) 4 MG tablet Take 1 tablet (4 mg total) by mouth every 6 (six) hours. 04/07/20   Couture, Cortni S, PA-C  predniSONE (STERAPRED UNI-PAK) 10 MG tablet 6 tabs po day 1, 5 tabs po day 2, 4 tabs po day 3, 3 tabs po day 4, 2 tabs po day 5, 1 tab po day 6 10/13/14   Hudnall, 12/13/14, MD  PROAIR HFA 108 (90 BASE) MCG/ACT inhaler  08/14/14   [provider]  sucralfate (CARAFATE) 1 GM/10ML suspension Take 10 mLs (1 g total) by mouth 4 (four) times daily -  with meals and at bedtime. 04/07/20   Couture, Cortni S, PA-C    Allergies    Patient  has no known allergies.  Review of Systems   Review of Systems  Constitutional: Negative for chills and fever.  HENT: Negative.   Respiratory: Negative for cough and shortness of breath.   Cardiovascular: Negative for chest pain.  Gastrointestinal: Positive for abdominal pain, nausea and vomiting. Negative for blood in stool, constipation and diarrhea.  Genitourinary: Negative for dysuria.  Musculoskeletal: Negative for arthralgias and myalgias.  Skin: Negative for color change and rash.  Neurological: Negative for dizziness, syncope and light-headedness.  All other systems reviewed and are negative.   Physical Exam Updated Vital Signs BP (!) 184/113 (BP Location: Right Arm)   Pulse 63   Temp 98.1 F (36.7 C) (Oral)   Resp 14   Ht 6' (1.829 m)   Wt 130.2 kg   SpO2 100%   BMI 38.93 kg/m   Physical Exam Vitals and nursing note reviewed.  Constitutional:      General: He is not in acute distress.    Appearance: He is well-developed. He is not diaphoretic.  HENT:     Head: Normocephalic and atraumatic.     Mouth/Throat:     Comments: Mucous membranes slightly dry Eyes:     General:        Right eye: No discharge.        Left eye: No discharge.     Pupils: Pupils are equal, round, and reactive to light.  Cardiovascular:     Rate and Rhythm: Normal rate and regular rhythm.     Heart sounds: Normal heart sounds.  Pulmonary:     Effort: Pulmonary effort is normal. No respiratory distress.     Breath sounds: Normal breath sounds. No wheezing or rales.     Comments: Respirations equal and unlabored, patient able to speak in full sentences, lungs clear to auscultation bilaterally Abdominal:     General: Bowel sounds are normal. There is no distension.     Palpations: Abdomen is soft. There is no mass.     Tenderness: There is no abdominal tenderness. There is no guarding.     Comments: Abdomen is soft, nondistended, bowel sounds present throughout, patient reports some  slight tenderness only when cramping is present but on exam he has no tenderness, guarding or peritoneal signs  Musculoskeletal:        General: No deformity.     Cervical back: Neck supple.  Skin:    General: Skin is warm and dry.     Capillary Refill: Capillary refill takes less than 2 seconds.  Neurological:     Mental Status: He is alert.  Coordination: Coordination normal.     Comments: Speech is clear, able to follow commands Moves extremities without ataxia, coordination intact  Psychiatric:        Mood and Affect: Mood normal.        Behavior: Behavior normal.     ED Results / Procedures / Treatments   Labs (all labs ordered are listed, but only abnormal results are displayed) Labs Reviewed  COMPREHENSIVE METABOLIC PANEL - Abnormal; Notable for the following components:      Result Value   Sodium 132 (*)    Chloride 97 (*)    Glucose, Bld 337 (*)    AST 12 (*)    Total Bilirubin 3.2 (*)    All other components within normal limits  CBC WITH DIFFERENTIAL/PLATELET - Abnormal; Notable for the following components:   MCV 79.4 (*)    All other components within normal limits  URINALYSIS, ROUTINE W REFLEX MICROSCOPIC - Abnormal; Notable for the following components:   Glucose, UA >=500 (*)    Ketones, ur 15 (*)    All other components within normal limits  URINALYSIS, MICROSCOPIC (REFLEX) - Abnormal; Notable for the following components:   Bacteria, UA RARE (*)    All other components within normal limits  CBG MONITORING, ED - Abnormal; Notable for the following components:   Glucose-Capillary 242 (*)    All other components within normal limits  LIPASE, BLOOD    EKG None  Radiology No results found.  Procedures Procedures (including critical care time)  Medications Ordered in ED Medications  sodium chloride 0.9 % bolus 2,000 mL (0 mLs Intravenous Stopped 04/09/20 2234)  ondansetron (ZOFRAN) injection 4 mg (4 mg Intravenous Given 04/09/20 2109)    famotidine (PEPCID) IVPB 20 mg premix (0 mg Intravenous Stopped 04/09/20 2151)  dicyclomine (BENTYL) injection 20 mg (20 mg Intramuscular Given 04/09/20 2045)    ED Course  I have reviewed the triage vital signs and the nursing notes.  Pertinent labs & imaging results that were available during my care of the patient were reviewed by me and considered in my medical decision making (see chart for details).    MDM Rules/Calculators/A&P                          Patient presents to the ED with complaints of abdominal pain. Patient nontoxic appearing, in no apparent distress, vitals WNL. On exam patient with very mild generalized tednerness, he states pain is primarily during intermittent cramping, no peritoneal signs. Will evaluate with labs, patient had reassuring CT on 10/1 and do not feel that repeating that would be beneficial at this time. Analgesics, anti-emetics, and fluids administered.   ER work-up reviewed:  CBC: No leukocytosis, normal hemoglobin CMP: Sodium of 132 and chloride of 97 suggest dehydration, glucose of 337 with no anion gap and normal CO2, no signs of DKA, normal renal and liver function aside from bili of 3.2 which appears to be chronic for patient and he does not have focal right upper quadrant tenderness. Lipase: WNL UA: No evidence of infection  On repeat abdominal exam patient remains without peritoneal signs, doubt cholecystitis, pancreatitis, diverticulitis, appendicitis, bowel obstruction/perforation. Patient tolerating PO in the emergency department. Will discharge home with supportive measures. I discussed results, treatment plan, need for PCP follow-up, and return precautions with the patient. Provided opportunity for questions, patient confirmed understanding and is in agreement with plan.    Final Clinical Impression(s) / ED Diagnoses Final diagnoses:  Non-intractable vomiting with nausea, unspecified vomiting type  Abdominal cramping    Rx / DC  Orders ED Discharge Orders         Ordered    dicyclomine (BENTYL) 20 MG tablet  2 times daily        04/09/20 2340    ondansetron (ZOFRAN ODT) 4 MG disintegrating tablet        04/09/20 2340           Jodi Geralds [place], [place] 04/12/20 0254    Terald Sleeper, MD 04/14/20 0930

## 2020-04-09 NOTE — ED Triage Notes (Signed)
Abdominal pain and vomiting. He was seen at [facility] 3 days ago for the same.

## 2020-04-09 NOTE — ED Notes (Signed)
Urine sent to lab for hold. No UA orders at this time.

## 2020-04-10 NOTE — ED Provider Notes (Signed)
[facility]   [id number] 04/06/20 Arrival Time: 1835  ASSESSMENT & PLAN:  1. Upper abdominal pain   2. Intractable vomiting with nausea, unspecified vomiting type     Given amount of pain he appears to be in, recommend ED evaluation. He agrees. Stable upon d/c.  Meds ordered this encounter  Medications  . ondansetron (ZOFRAN-ODT) disintegrating tablet 4 mg     Follow-up Information    Go to  [facility] EMERGENCY DEPARTMENT.   Specialty: Emergency Medicine Contact information: [address] [id number] [place] [address] [phone]              Reviewed expectations re: course of current medical issues. Questions answered. Outlined signs and symptoms indicating need for more acute intervention. Understanding verbalized. After Visit Summary given.   SUBJECTIVE: History from: patient. Luis Orozco is a 35 y.o. male who presents with upper abdominal pain; few days; cramping; worsening; n/v beginning today. No fever reported. Ambulatory.    OBJECTIVE:  Vitals:   04/06/20 1941  BP: (!) 153/88  Pulse: 85  Resp: (!) 22  Temp: 97.9 F (36.6 C)  TempSrc: Oral  SpO2: 100%    General appearance: alert; no distress but appears to be in pain; holding arm against abdomen Eyes: PERRLA; EOMI; conjunctiva normal HENT: [place]; AT; without nasal congestion Neck: supple  Lungs: speaks full sentences without difficulty; unlabored Abd: soft; ND; vague TTP but without guarding/rebound Extremities: no edema Skin: warm and dry Neurologic: normal gait Psychological: alert and cooperative; normal mood and affect    No Known Allergies  Past Medical History:  Diagnosis Date  . Diabetes mellitus    Social History   Socioeconomic History  . Marital status: Married    Spouse name: Not on file  . Number of children: Not on file  . Years of education: Not on file  . Highest education level: Not on file    Occupational History  . Not on file  Tobacco Use  . Smoking status: Never Smoker  . Smokeless tobacco: Never Used  Substance and Sexual Activity  . Alcohol use: No    Alcohol/week: 0.0 standard drinks  . Drug use: No  . Sexual activity: Yes  Other Topics Concern  . Not on file  Social History Narrative  . Not on file   Social Determinants of Health   Financial Resource Strain:   . Difficulty of Paying Living Expenses: Not on file  Food Insecurity:   . Worried About Programme researcher, broadcasting/film/video in the Last Year: Not on file  . Ran Out of Food in the Last Year: Not on file  Transportation Needs:   . Lack of Transportation (Medical): Not on file  . Lack of Transportation (Non-Medical): Not on file  Physical Activity:   . Days of Exercise per Week: Not on file  . Minutes of Exercise per Session: Not on file  Stress:   . Feeling of Stress : Not on file  Social Connections:   . Frequency of Communication with Friends and Family: Not on file  . Frequency of Social Gatherings with Friends and Family: Not on file  . Attends Religious Services: Not on file  . Active Member of Clubs or Organizations: Not on file  . Attends Banker Meetings: Not on file  . Marital Status: Not on file  Intimate Partner Violence:   . Fear of Current or Ex-Partner: Not on file  . Emotionally Abused: Not on file  .  Physically Abused: Not on file  . Sexually Abused: Not on file   Family History  Problem Relation Age of Onset  . Diabetes Father   . Hyperlipidemia Father   . Hypertension Father   . Heart attack Father   . Obesity Father   . Diabetes Brother   . Diabetes Paternal Grandmother   . Diabetes Brother    Past Surgical History:  Procedure Laterality Date  . FEMUR FRACTURE SURGERY       Mardella Layman, MD 04/10/20 [phone]

## 2020-09-18 ENCOUNTER — Other Ambulatory Visit: Payer: Self-pay

## 2020-09-18 ENCOUNTER — Emergency Department (HOSPITAL_BASED_OUTPATIENT_CLINIC_OR_DEPARTMENT_OTHER)
Admission: EM | Admit: 2020-09-18 | Discharge: 2020-09-19 | Disposition: A | Discharge status: Home / Self Care | Payer: Managed Care, Other (non HMO) | Attending: Emergency Medicine | Admitting: Emergency Medicine

## 2020-09-18 DIAGNOSIS — R1030 Lower abdominal pain, unspecified: Secondary | ICD-10-CM | POA: Insufficient documentation

## 2020-09-18 DIAGNOSIS — R1084 Generalized abdominal pain: Secondary | ICD-10-CM

## 2020-09-18 DIAGNOSIS — E119 Type 2 diabetes mellitus without complications: Secondary | ICD-10-CM | POA: Diagnosis not present

## 2020-09-18 DIAGNOSIS — Z7984 Long term (current) use of oral hypoglycemic drugs: Secondary | ICD-10-CM | POA: Diagnosis not present

## 2020-09-18 DIAGNOSIS — R112 Nausea with vomiting, unspecified: Secondary | ICD-10-CM | POA: Diagnosis not present

## 2020-09-18 MED ORDER — FENTANYL CITRATE (PF) 100 MCG/2ML IJ SOLN
50.0000 ug | INTRAMUSCULAR | Status: DC | PRN
  Administered 2020-09-19: 50 ug via INTRAVENOUS
  Filled 2020-09-18: qty 2

## 2020-09-18 MED ORDER — ONDANSETRON HCL 4 MG/2ML IJ SOLN
4.0000 mg | Freq: Once | INTRAMUSCULAR | Status: AC | PRN
  Administered 2020-09-19: 4 mg via INTRAVENOUS
  Filled 2020-09-18: qty 2

## 2020-09-18 NOTE — ED Triage Notes (Signed)
Pt states abdominal pain this morning had bm and emesis and tried to lay down did not feel better. States last time was Dx with diverticulitis. Unable to hold down foods or fluids.

## 2020-09-19 LAB — COMPREHENSIVE METABOLIC PANEL
ALT: 15 U/L (ref 0–44)
AST: 17 U/L (ref 15–41)
Albumin: 4.2 g/dL (ref 3.5–5.0)
Alkaline Phosphatase: 91 U/L (ref 38–126)
Anion gap: 13 (ref 5–15)
BUN: 11 mg/dL (ref 6–20)
CO2: 23 mmol/L (ref 22–32)
Calcium: 9.8 mg/dL (ref 8.9–10.3)
Chloride: 100 mmol/L (ref 98–111)
Creatinine, Ser: 0.69 mg/dL (ref 0.61–1.24)
GFR, Estimated: >60 mL/min (ref >60)
Glucose, Bld: 336 mg/dL — ABNORMAL HIGH (ref 70–99)
Potassium: 4.3 mmol/L (ref 3.5–5.1)
Sodium: 136 mmol/L (ref 135–145)
Total Bilirubin: 2.1 mg/dL — ABNORMAL HIGH (ref 0.3–1.2)
Total Protein: 8.3 g/dL — ABNORMAL HIGH (ref 6.5–8.1)

## 2020-09-19 LAB — CBC WITH DIFFERENTIAL/PLATELET
Abs Immature Granulocytes: 0.01 10*3/uL (ref 0.00–0.07)
Basophils Absolute: 0 10*3/uL (ref 0.0–0.1)
Basophils Relative: 0 %
Eosinophils Absolute: 0 10*3/uL (ref 0.0–0.5)
Eosinophils Relative: 0 %
HCT: 44.6 % (ref 39.0–52.0)
Hemoglobin: 16.1 g/dL (ref 13.0–17.0)
Immature Granulocytes: 0 %
Lymphocytes Relative: 18 %
Lymphs Abs: 1.3 10*3/uL (ref 0.7–4.0)
MCH: 28 pg (ref 26.0–34.0)
MCHC: 36.1 g/dL — ABNORMAL HIGH (ref 30.0–36.0)
MCV: 77.6 fL — ABNORMAL LOW (ref 80.0–100.0)
Monocytes Absolute: 0.3 10*3/uL (ref 0.1–1.0)
Monocytes Relative: 5 %
Neutro Abs: 5.4 10*3/uL (ref 1.7–7.7)
Neutrophils Relative %: 77 %
Platelets: 275 10*3/uL (ref 150–400)
RBC: 5.75 MIL/uL (ref 4.22–5.81)
RDW: 11.9 % (ref 11.5–15.5)
WBC: 7 10*3/uL (ref 4.0–10.5)
nRBC: 0 % (ref 0.0–0.2)

## 2020-09-19 LAB — LIPASE, BLOOD: Lipase: 35 U/L (ref 11–51)

## 2020-09-19 MED ORDER — ONDANSETRON 8 MG PO TBDP: 8.0000 mg | ORAL_TABLET | Freq: Three times a day (TID) | ORAL | 0 refills | Status: AC | PRN

## 2020-09-19 NOTE — ED Provider Notes (Signed)
[facility] Provider Note: Lowella Dell, MD, FACEP  CSN: [id number] MRN: [id number] ARRIVAL: 09/18/20 at 2332 ROOM: MH02/MH02   CHIEF COMPLAINT  Abdominal Pain   HISTORY OF PRESENT ILLNESS  09/19/20 1:50 AM Luis Orozco is a 35 y.o. male who had nausea and vomiting beginning yesterday morning while at work.  He was unable to hold down food or fluids.  He subsequently developed lower abdominal pain primarily in the middle and lower left.  He describes the pain as feeling like his abdomen was going to explode.  It was worse with movement or palpation.  He rated the pain is a 10 out of 10.  He was given 50 mcg of fentanyl and 4 mg of Zofran IV per protocol about 2 hours ago and is now asymptomatic.   Past Medical History:  Diagnosis Date  . Diabetes mellitus     Past Surgical History:  Procedure Laterality Date  . FEMUR FRACTURE SURGERY      Family History  Problem Relation Age of Onset  . Diabetes Father   . Hyperlipidemia Father   . Hypertension Father   . Heart attack Father   . Obesity Father   . Diabetes Brother   . Diabetes Paternal Grandmother   . Diabetes Brother     Social History   Tobacco Use  . Smoking status: Never Smoker  . Smokeless tobacco: Never Used  Substance Use Topics  . Alcohol use: No    Alcohol/week: 0.0 standard drinks  . Drug use: No    Prior to Admission medications   Medication Sig Start Date End Date Taking? Authorizing Provider  ondansetron (ZOFRAN ODT) 8 MG disintegrating tablet Take 1 tablet (8 mg total) by mouth every 8 (eight) hours as needed for nausea or vomiting. 09/19/20  Yes Nola Botkins, MD  dicyclomine (BENTYL) 20 MG tablet Take 1 tablet (20 mg total) by mouth 2 (two) times daily. 04/09/20   Dartha Lodge, PA-C  Efinaconazole (JUBLIA) 10 % SOLN Apply 1 drop topically Nightly. 04/13/13   Sheard, Myeong O, DPM  famotidine (PEPCID) 20 MG tablet Take 1 tablet (20 mg total) by mouth 2 (two) times daily for 14  days. 04/07/20 04/21/20  Couture, Cortni S, PA-C  GREEN TEA, CAMILLIA SINENSIS, PO Take 1 tablet by mouth daily before lunch.    [provider]  metFORMIN (GLUCOPHAGE) 500 MG tablet Take 500 mg by mouth 2 (two) times daily with a meal.    [provider]  PROAIR HFA 108 (90 BASE) MCG/ACT inhaler  08/14/14   [provider]  sucralfate (CARAFATE) 1 GM/10ML suspension Take 10 mLs (1 g total) by mouth 4 (four) times daily -  with meals and at bedtime. 04/07/20   Couture, Cortni S, PA-C    Allergies Patient has no known allergies.   REVIEW OF SYSTEMS  Negative except as noted here or in the History of Present Illness.   PHYSICAL EXAMINATION  Initial Vital Signs Blood pressure (!) 148/91, pulse 78, temperature 98.5 F (36.9 C), temperature source Oral, resp. rate (!) 21, height 6' (1.829 m), weight 97.6 kg, SpO2 99 %.  Examination General: Well-developed, well-nourished male in no acute distress; appearance consistent with age of record HENT: normocephalic; atraumatic Eyes: pupils equal, round and reactive to light; extraocular muscles intact Neck: supple Heart: regular rate and rhythm Lungs: clear to auscultation bilaterally Abdomen: soft; nondistended; nontender; no masses or hepatosplenomegaly; bowel sounds present Extremities: No deformity; full range of motion; pulses  normal Neurologic: Awake, alert and oriented; motor function intact in all extremities and symmetric; no facial droop Skin: Warm and dry Psychiatric: Normal mood and affect   RESULTS  Summary of this visit's results, reviewed and interpreted by myself:   EKG Interpretation  Date/Time:    Ventricular Rate:    PR Interval:    QRS Duration:   QT Interval:    QTC Calculation:   R Axis:     Text Interpretation:        Laboratory Studies: Results for orders placed or performed during the hospital encounter of 09/18/20 (from the past 24 hour(s))  Lipase, blood     Status: None    Collection Time: 09/19/20 12:05 AM  Result Value Ref Range   Lipase 35 11 - 51 U/L  Comprehensive metabolic panel     Status: Abnormal   Collection Time: 09/19/20 12:05 AM  Result Value Ref Range   Sodium 136 135 - 145 mmol/L   Potassium 4.3 3.5 - 5.1 mmol/L   Chloride 100 98 - 111 mmol/L   CO2 23 22 - 32 mmol/L   Glucose, Bld 336 (H) 70 - 99 mg/dL   BUN 11 6 - 20 mg/dL   Creatinine, Ser 0.01 0.61 - 1.24 mg/dL   Calcium 9.8 8.9 - 74.9 mg/dL   Total Protein 8.3 (H) 6.5 - 8.1 g/dL   Albumin 4.2 3.5 - 5.0 g/dL   AST 17 15 - 41 U/L   ALT 15 0 - 44 U/L   Alkaline Phosphatase 91 38 - 126 U/L   Total Bilirubin 2.1 (H) 0.3 - 1.2 mg/dL   GFR, Estimated >44 >96 mL/min   Anion gap 13 5 - 15  CBC with Differential     Status: Abnormal   Collection Time: 09/19/20 12:05 AM  Result Value Ref Range   WBC 7.0 4.0 - 10.5 K/uL   RBC 5.75 4.22 - 5.81 MIL/uL   Hemoglobin 16.1 13.0 - 17.0 g/dL   HCT 75.9 16.3 - 84.6 %   MCV 77.6 (L) 80.0 - 100.0 fL   MCH 28.0 26.0 - 34.0 pg   MCHC 36.1 (H) 30.0 - 36.0 g/dL   RDW 65.9 93.5 - 70.1 %   Platelets 275 150 - 400 K/uL   nRBC 0.0 0.0 - 0.2 %   Neutrophils Relative % 77 %   Neutro Abs 5.4 1.7 - 7.7 K/uL   Lymphocytes Relative 18 %   Lymphs Abs 1.3 0.7 - 4.0 K/uL   Monocytes Relative 5 %   Monocytes Absolute 0.3 0.1 - 1.0 K/uL   Eosinophils Relative 0 %   Eosinophils Absolute 0.0 0.0 - 0.5 K/uL   Basophils Relative 0 %   Basophils Absolute 0.0 0.0 - 0.1 K/uL   Immature Granulocytes 0 %   Abs Immature Granulocytes 0.01 0.00 - 0.07 K/uL   Imaging Studies: No results found.  ED COURSE and MDM  Nursing notes, initial and subsequent vitals signs, including pulse oximetry, reviewed and interpreted by myself.  Vitals:   09/18/20 2345 09/18/20 2347 09/19/20 0017 09/19/20 0149  BP:  (!) 148/98 (!) 157/100 (!) 148/91  Pulse:  98 78 78  Resp:  (!) 24  (!) 21  Temp:  98.5 F (36.9 C)  98.5 F (36.9 C)  TempSrc:  Oral  Oral  SpO2:  100% 100% 99%   Weight: 97.6 kg     Height: 6' (1.829 m)      Medications  fentaNYL (SUBLIMAZE) injection 50  mcg (50 mcg Intravenous Given 09/19/20 0013)  ondansetron (ZOFRAN) injection 4 mg (4 mg Intravenous Given 09/19/20 0013)   The patient's symptoms have abated.  His examination is benign at this time.  His laboratory studies are within normal limits except for hyperglycemia (patient has known diabetes) and mildly elevated total bilirubin.  He was advised to return if symptoms worsen but I do not believe any imaging studies or other work-up are indicated at this time   PROCEDURES  Procedures   ED DIAGNOSES     ICD-10-CM   1. Generalized abdominal pain  R10.84   2. Nausea and vomiting in adult  R11.2        Paula Libra, MD 09/19/20 [phone]

## 2021-04-04 IMAGING — CT CT ABD-PELV W/ CM
2 of 3 series · 16 of 46 positions shown, 18 images · IV contrast (APPLIED)
Comparison: None.

CLINICAL DATA: Pt present abdominal pain with vomiting. [REDACTED]
patient ate some fish and afterwards he started having cramping. Pt
started vomiting today

EXAM:
CT ABDOMEN AND PELVIS WITH CONTRAST
TECHNIQUE: Multidetector CT imaging of the abdomen and pelvis was performed
using the standard protocol following bolus administration of
intravenous contrast.
CONTRAST:  100mL OMNIPAQUE IOHEXOL 300 MG/ML  SOLN

[Series 5: lung · axial · 0.97mm/px · z∈[+1281,+1401]mm · 13 of 70 slices shown, 15 images]
[im 5/70  soft-tissue]
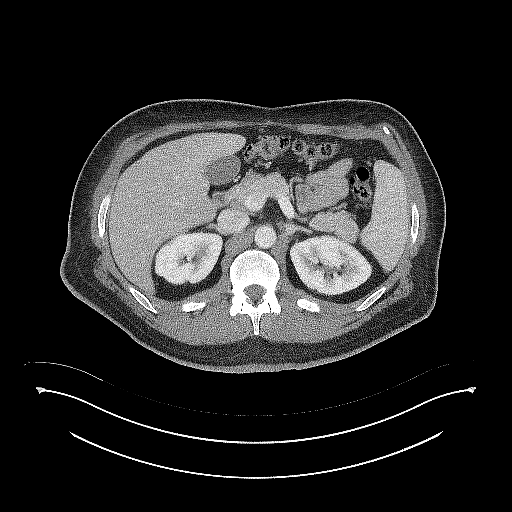
[im 5/70  bone]
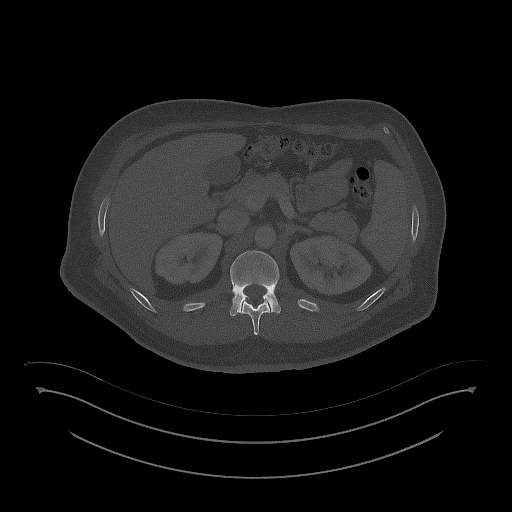
[im 9/70  soft-tissue]
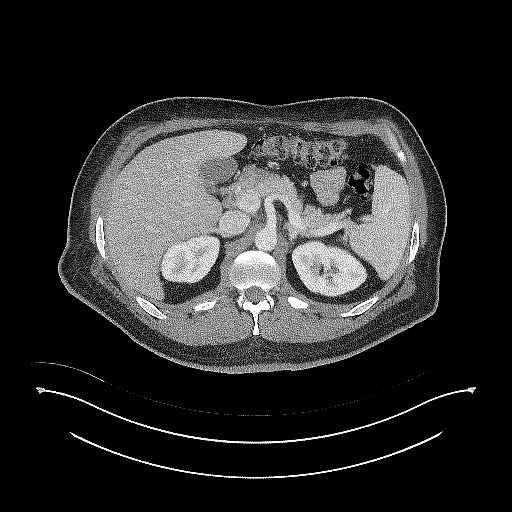
[im 14/70  soft-tissue]
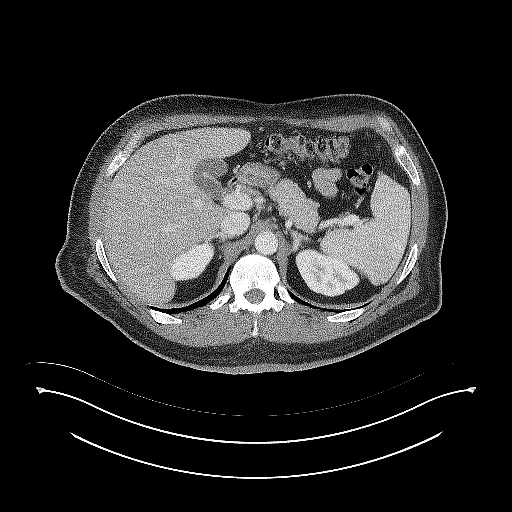
[im 21/70  soft-tissue]
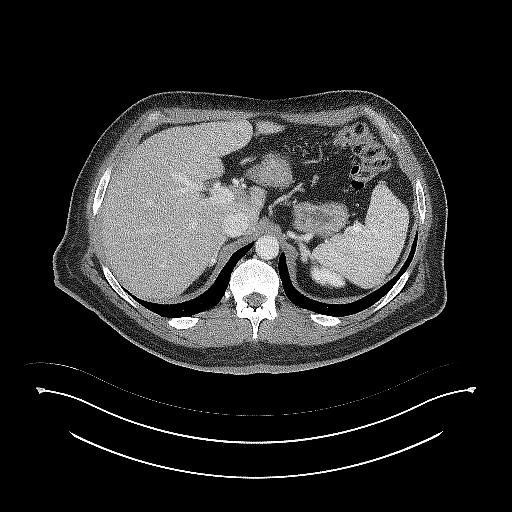
[im 25/70  soft-tissue]
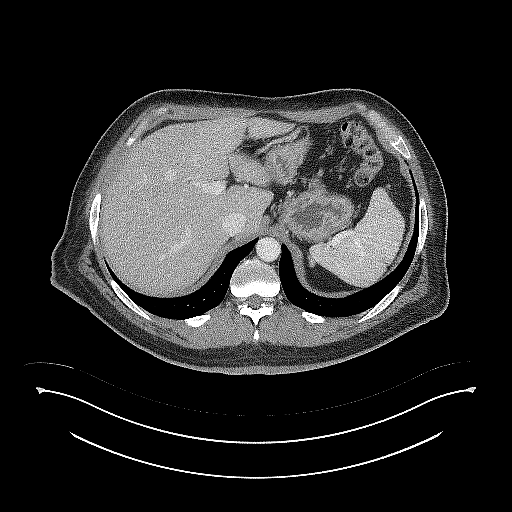
[im 29/70  soft-tissue]
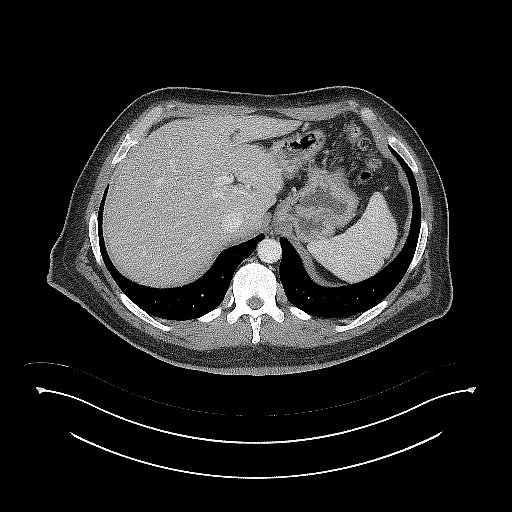
[im 36/70  soft-tissue]
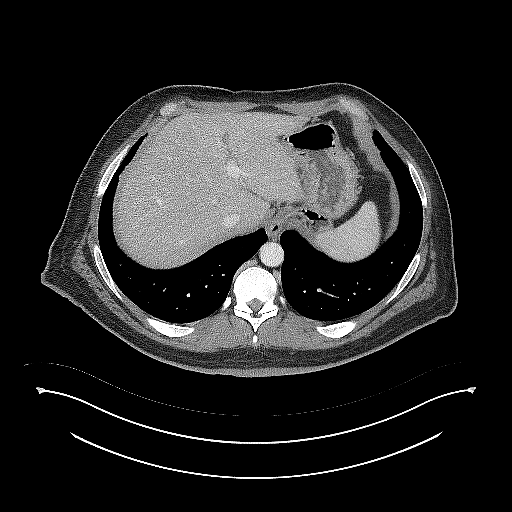
[im 41/70  soft-tissue]
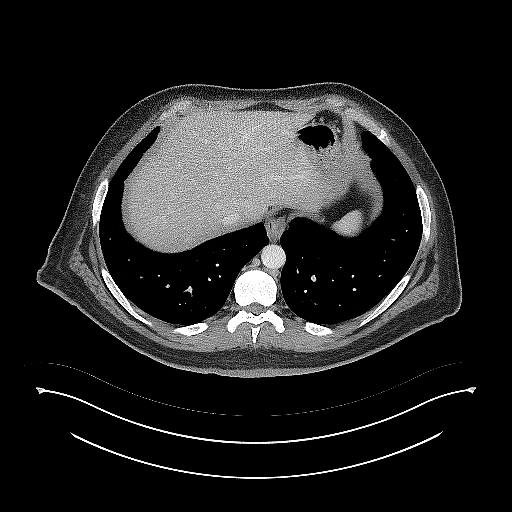
[im 45/70  soft-tissue]
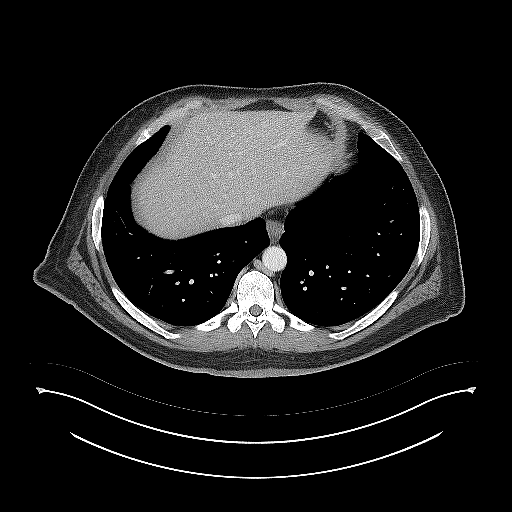
[im 45/70  bone]
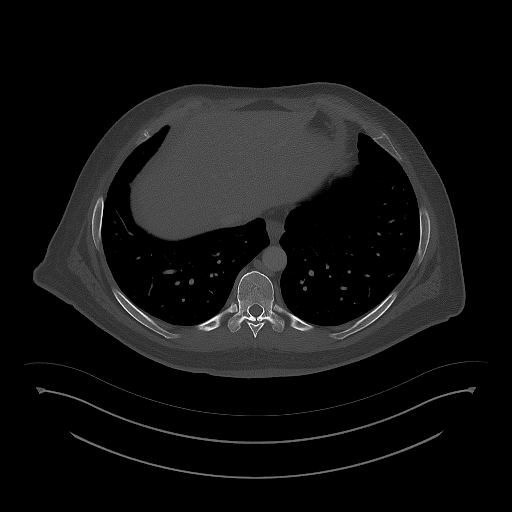
[im 49/70  soft-tissue]
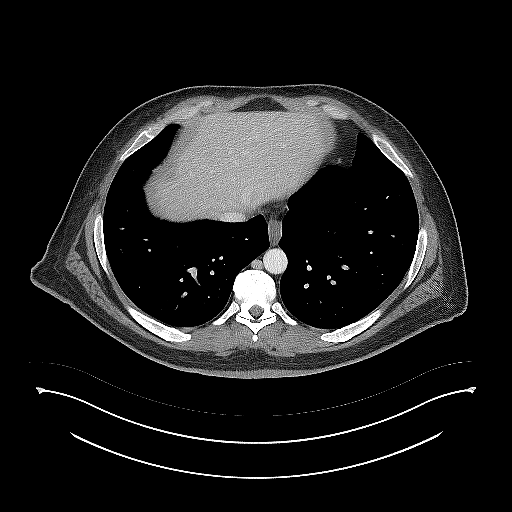
[im 56/70  soft-tissue]
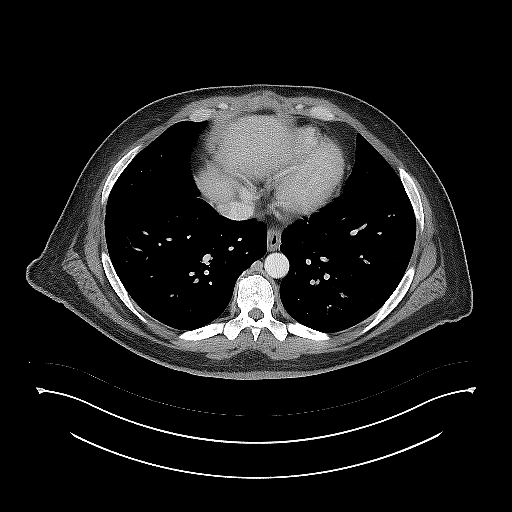
[im 61/70  soft-tissue]
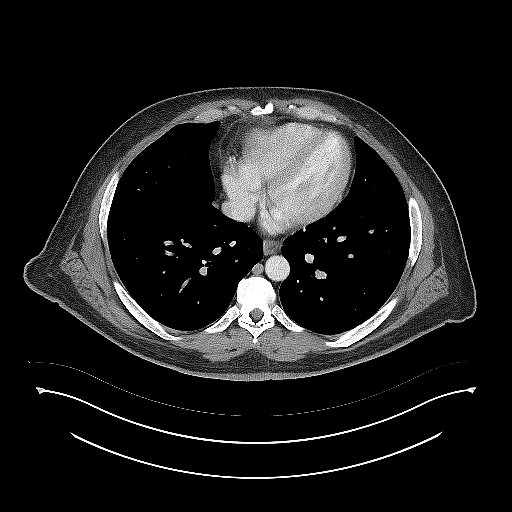
[im 65/70  soft-tissue]
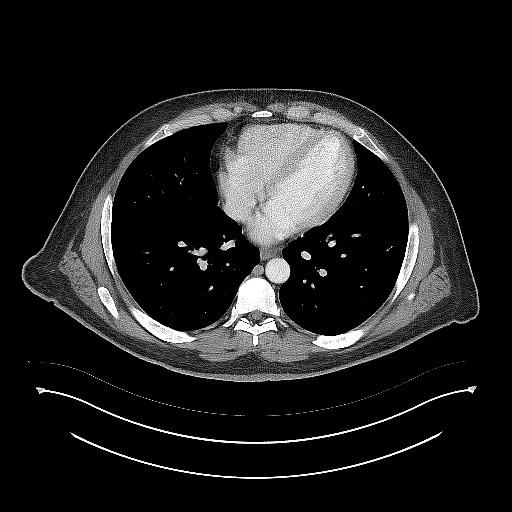

[Series 6: abdomen 3.0 mpr cor · coronal · 0.88mm/px · 3 of 109 slices shown]
[im 37/109  soft-tissue]
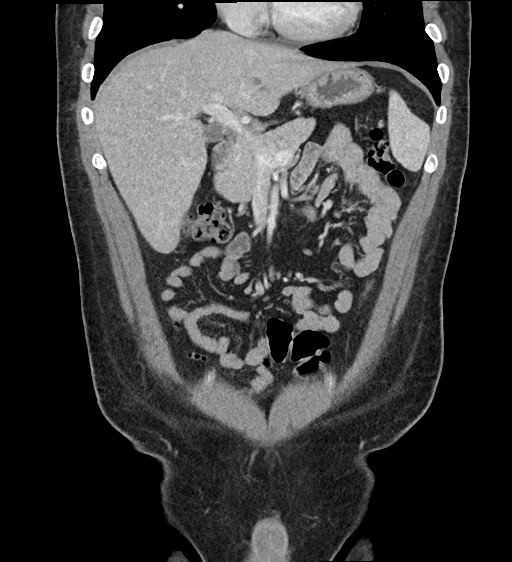
[im 49/109  soft-tissue]
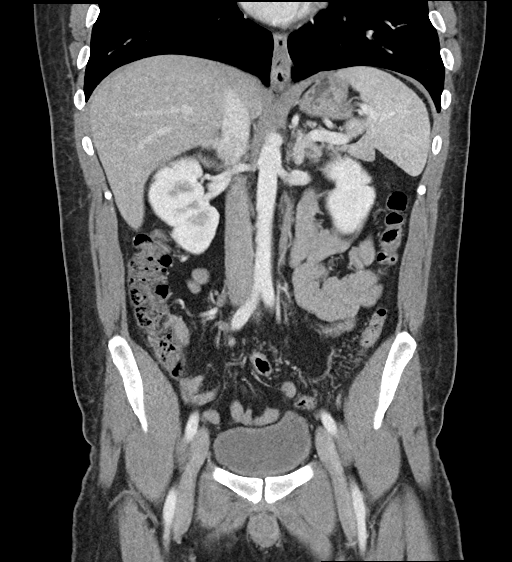
[im 61/109  soft-tissue]
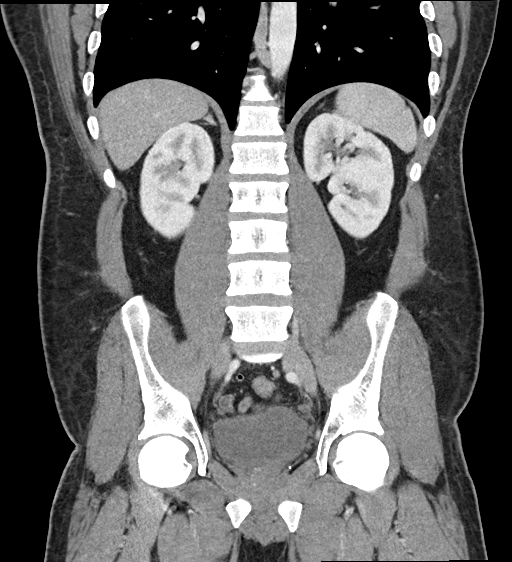

[16 of 46 positions shown; findings below may reference images not displayed]

FINDINGS: Lower chest: No acute abnormality.

Hepatobiliary: Subcentimeter hypodensity within the left hepatic
lobe is small to characterize. Otherwise no focal liver abnormality
is seen. No gallstones, gallbladder wall thickening, or biliary
dilatation.

Pancreas: Unremarkable. No pancreatic ductal dilatation or
surrounding inflammatory changes.

Spleen: Normal in size without focal abnormality.

Adrenals/Urinary Tract: No adrenal nodule bilaterally. Bilateral
kidneys enhance symmetrically. Punctate nephrolithiasis within the
inferior pole of the right kidney open ([DATE]). Total of 2 calcified
stones are noted within the left kidney measuring 0.5 and 0.2 cm
([DATE]). Subcentimeter hypodensity along the inferior pole of the
left kidney is too small to characterize. No hydronephrosis. No
hydroureter. The urinary bladder is unremarkable.

Stomach/Bowel: Suggestion of a gastric fundus diverticulum open
([DATE], [DATE]). Stomach is within normal limits. Appendix appears
normal. No evidence of bowel wall thickening, distention, or
inflammatory changes. Few scattered colonic diverticula

Vascular/Lymphatic: Several phleboliths identified within the
pelvis. No abdominal aorta or iliac aneurysm.No abdominal, pelvic,
or inguinal lymphadenopathy.

Reproductive: Prostate is unremarkable.

Other: No intraperitoneal free fluid. No intraperitoneal free gas.
No organized fluid collection.

Musculoskeletal: No acute or significant osseous findings. Partially
visualized fixation of the proximal left femur.
IMPRESSION: 1. Suggestion of gastric fundus diverticulum.
2. Nonobstructive bilateral nephrolithiasis (punctate on the right,
measuring up to 5 mm on the left).
3. Scattered colonic diverticula with no acute diverticulitis.

## 2022-07-23 ENCOUNTER — Encounter (HOSPITAL_BASED_OUTPATIENT_CLINIC_OR_DEPARTMENT_OTHER): Payer: Self-pay | Admitting: Emergency Medicine

## 2022-07-23 ENCOUNTER — Emergency Department (HOSPITAL_BASED_OUTPATIENT_CLINIC_OR_DEPARTMENT_OTHER)
Admission: EM | Admit: 2022-07-23 | Discharge: 2022-07-23 | Disposition: A | Discharge status: Home / Self Care | Payer: Managed Care, Other (non HMO) | Attending: Emergency Medicine | Admitting: Emergency Medicine

## 2022-07-23 ENCOUNTER — Emergency Department (HOSPITAL_BASED_OUTPATIENT_CLINIC_OR_DEPARTMENT_OTHER): Payer: Managed Care, Other (non HMO)

## 2022-07-23 DIAGNOSIS — R1084 Generalized abdominal pain: Secondary | ICD-10-CM | POA: Diagnosis present

## 2022-07-23 DIAGNOSIS — R112 Nausea with vomiting, unspecified: Secondary | ICD-10-CM

## 2022-07-23 DIAGNOSIS — R7309 Other abnormal glucose: Secondary | ICD-10-CM | POA: Diagnosis not present

## 2022-07-23 DIAGNOSIS — Z7984 Long term (current) use of oral hypoglycemic drugs: Secondary | ICD-10-CM | POA: Insufficient documentation

## 2022-07-23 HISTORY — DX: Diverticulitis of intestine, part unspecified, without perforation or abscess without bleeding: K57.92

## 2022-07-23 LAB — CBC
HCT: 45.4 % (ref 39.0–52.0)
Hemoglobin: 15.9 g/dL (ref 13.0–17.0)
MCH: 27.6 pg (ref 26.0–34.0)
MCHC: 35 g/dL (ref 30.0–36.0)
MCV: 78.7 fL — ABNORMAL LOW (ref 80.0–100.0)
Platelets: 339 10*3/uL (ref 150–400)
RBC: 5.77 MIL/uL (ref 4.22–5.81)
RDW: 11.9 % (ref 11.5–15.5)
WBC: 8 10*3/uL (ref 4.0–10.5)
nRBC: 0 % (ref 0.0–0.2)

## 2022-07-23 LAB — COMPREHENSIVE METABOLIC PANEL
ALT: 16 U/L (ref 0–44)
AST: 15 U/L (ref 15–41)
Albumin: 4.4 g/dL (ref 3.5–5.0)
Alkaline Phosphatase: 74 U/L (ref 38–126)
Anion gap: 9 (ref 5–15)
BUN: 10 mg/dL (ref 6–20)
CO2: 29 mmol/L (ref 22–32)
Calcium: 9.3 mg/dL (ref 8.9–10.3)
Chloride: 97 mmol/L — ABNORMAL LOW (ref 98–111)
Creatinine, Ser: 0.78 mg/dL (ref 0.61–1.24)
GFR, Estimated: >60 mL/min (ref >60)
Glucose, Bld: 124 mg/dL — ABNORMAL HIGH (ref 70–99)
Potassium: 3.6 mmol/L (ref 3.5–5.1)
Sodium: 135 mmol/L (ref 135–145)
Total Bilirubin: 2.5 mg/dL — ABNORMAL HIGH (ref 0.3–1.2)
Total Protein: 8.8 g/dL — ABNORMAL HIGH (ref 6.5–8.1)

## 2022-07-23 LAB — URINALYSIS, ROUTINE W REFLEX MICROSCOPIC
Bilirubin Urine: NEGATIVE
Glucose, UA: NEGATIVE mg/dL
Hgb urine dipstick: NEGATIVE
Ketones, ur: 15 mg/dL — AB
Nitrite: NEGATIVE
Protein, ur: NEGATIVE mg/dL
Specific Gravity, Urine: 1.015 (ref 1.005–1.030)
pH: 8 (ref 5.0–8.0)

## 2022-07-23 LAB — URINALYSIS, MICROSCOPIC (REFLEX): RBC / HPF: NONE SEEN RBC/hpf (ref 0–5)

## 2022-07-23 LAB — LIPASE, BLOOD: Lipase: 48 U/L (ref 11–51)

## 2022-07-23 MED ORDER — DICYCLOMINE HCL 20 MG PO TABS: 20.0000 mg | ORAL_TABLET | Freq: Two times a day (BID) | ORAL | 0 refills | Status: AC

## 2022-07-23 MED ORDER — SUCRALFATE 1 GM/10ML PO SUSP: 1.0000 g | Freq: Three times a day (TID) | ORAL | 0 refills | Status: AC

## 2022-07-23 MED ORDER — FAMOTIDINE IN NACL 20-0.9 MG/50ML-% IV SOLN
20.0000 mg | Freq: Once | INTRAVENOUS | Status: AC
  Administered 2022-07-23: 20 mg via INTRAVENOUS
  Filled 2022-07-23: qty 50

## 2022-07-23 MED ORDER — MORPHINE SULFATE (PF) 4 MG/ML IV SOLN
4.0000 mg | Freq: Once | INTRAVENOUS | Status: AC
  Administered 2022-07-23: 4 mg via INTRAVENOUS
  Filled 2022-07-23: qty 1

## 2022-07-23 MED ORDER — SODIUM CHLORIDE 0.9 % IV BOLUS
1000.0000 mL | Freq: Once | INTRAVENOUS | Status: AC
  Administered 2022-07-23: 1000 mL via INTRAVENOUS

## 2022-07-23 MED ORDER — OMEPRAZOLE 20 MG PO CPDR: 20.0000 mg | DELAYED_RELEASE_CAPSULE | Freq: Every day | ORAL | 0 refills | Status: AC

## 2022-07-23 MED ORDER — IOHEXOL 300 MG/ML  SOLN
100.0000 mL | Freq: Once | INTRAMUSCULAR | Status: AC | PRN
  Administered 2022-07-23: 100 mL via INTRAVENOUS

## 2022-07-23 MED ORDER — ONDANSETRON HCL 4 MG/2ML IJ SOLN
4.0000 mg | Freq: Once | INTRAMUSCULAR | Status: AC
  Administered 2022-07-23: 4 mg via INTRAVENOUS
  Filled 2022-07-23: qty 2

## 2022-07-23 MED ORDER — ONDANSETRON HCL 4 MG PO TABS: 4.0000 mg | ORAL_TABLET | Freq: Four times a day (QID) | ORAL | 0 refills | Status: AC

## 2022-07-23 MED ORDER — ALUM & MAG HYDROXIDE-SIMETH 200-200-20 MG/5ML PO SUSP
30.0000 mL | Freq: Once | ORAL | Status: AC
  Administered 2022-07-23: 30 mL via ORAL
  Filled 2022-07-23: qty 30

## 2022-07-23 NOTE — ED Notes (Signed)
Pt had episode of emesis while RN was in the room, pt given 4mg  zofran as ordered by [facility].

## 2022-07-23 NOTE — ED Provider Notes (Signed)
[facility] EMERGENCY DEPARTMENT Provider Note   CSN: [id number] Arrival date & time: 07/23/22  1333     History  Chief Complaint  Patient presents with   Abdominal Pain    Luis Orozco is a 37 y.o. male who presents to the ED with concerns for generalized abdominal cramping x 3 days. Has a history of diverticulitis. Has associated nausea, vomiting, constipation. Last BM was 4 days ago. Recent colonic on 07/14/22. Tried silymarin for his symptoms without relief. Denies urinary symptoms, diarrhea, fever. Has a GI doctor, unsure of the name. Takes ozempic and metformin without missed doses. Was last evaluated by his endocrinologist yesterday.   The history is provided by the patient. No language interpreter was used.       Home Medications Prior to Admission medications   Medication Sig Start Date End Date Taking? Authorizing Provider  omeprazole (PRILOSEC) 20 MG capsule Take 1 capsule (20 mg total) by mouth daily. 07/23/22 08/22/22 Yes Sudeep Scheibel A, PA-C  ondansetron (ZOFRAN) 4 MG tablet Take 1 tablet (4 mg total) by mouth every 6 (six) hours. 07/23/22  Yes Laila Myhre A, PA-C  dicyclomine (BENTYL) 20 MG tablet Take 1 tablet (20 mg total) by mouth 2 (two) times daily. 07/23/22   Arjan Strohm A, PA-C  Efinaconazole (JUBLIA) 10 % SOLN Apply 1 drop topically Nightly. 04/13/13   Sheard, Myeong O, DPM  famotidine (PEPCID) 20 MG tablet Take 1 tablet (20 mg total) by mouth 2 (two) times daily for 14 days. 04/07/20 04/21/20  Couture, Cortni S, PA-C  GREEN TEA, CAMILLIA SINENSIS, PO Take 1 tablet by mouth daily before lunch.    [provider]  metFORMIN (GLUCOPHAGE) 500 MG tablet Take 500 mg by mouth 2 (two) times daily with a meal.    [provider]  ondansetron (ZOFRAN ODT) 8 MG disintegrating tablet Take 1 tablet (8 mg total) by mouth every 8 (eight) hours as needed for nausea or vomiting. 09/19/20   Molpus, John, MD  PROAIR HFA 108 (90 BASE) MCG/ACT inhaler   08/14/14   [provider]  sucralfate (CARAFATE) 1 GM/10ML suspension Take 10 mLs (1 g total) by mouth 4 (four) times daily -  with meals and at bedtime. 07/23/22   Bionca Mckey A, PA-C      Allergies    Patient has no known allergies.    Review of Systems   Review of Systems  Gastrointestinal:  Positive for abdominal pain.  All other systems reviewed and are negative.   Physical Exam Updated Vital Signs BP (!) 136/106 (BP Location: Right Arm)   Pulse (!) 102   Temp 98.3 F (36.8 C) (Oral)   Resp 20   Ht 6' (1.829 m)   Wt 101.6 kg   SpO2 100%   BMI 30.38 kg/m  Physical Exam Vitals and nursing note reviewed.  Constitutional:      General: He is not in acute distress.    Appearance: He is not diaphoretic.  HENT:     Head: Normocephalic and atraumatic.     Mouth/Throat:     Pharynx: No oropharyngeal exudate.  Eyes:     General: No scleral icterus.    Conjunctiva/sclera: Conjunctivae normal.  Cardiovascular:     Rate and Rhythm: Normal rate and regular rhythm.     Pulses: Normal pulses.     Heart sounds: Normal heart sounds.  Pulmonary:     Effort: Pulmonary effort is normal. No respiratory distress.     Breath  sounds: Normal breath sounds. No wheezing.  Abdominal:     General: Bowel sounds are normal.     Palpations: Abdomen is soft. There is no mass.     Tenderness: There is generalized abdominal tenderness. There is no guarding or rebound.     Comments: Diffuse abdominal TTP  Musculoskeletal:        General: Normal range of motion.     Cervical back: Normal range of motion and neck supple.  Skin:    General: Skin is warm and dry.  Neurological:     Mental Status: He is alert.  Psychiatric:        Behavior: Behavior normal.     ED Results / Procedures / Treatments   Labs (all labs ordered are listed, but only abnormal results are displayed) Labs Reviewed  COMPREHENSIVE METABOLIC PANEL - Abnormal; Notable for the following components:      Result  Value   Chloride 97 (*)    Glucose, Bld 124 (*)    Total Protein 8.8 (*)    Total Bilirubin 2.5 (*)    All other components within normal limits  CBC - Abnormal; Notable for the following components:   MCV 78.7 (*)    All other components within normal limits  URINALYSIS, ROUTINE W REFLEX MICROSCOPIC - Abnormal; Notable for the following components:   Ketones, ur 15 (*)    Leukocytes,Ua TRACE (*)    All other components within normal limits  URINALYSIS, MICROSCOPIC (REFLEX) - Abnormal; Notable for the following components:   Bacteria, UA RARE (*)    All other components within normal limits  LIPASE, BLOOD    EKG None  Radiology CT ABDOMEN PELVIS W CONTRAST  Result Date: 07/23/2022 CLINICAL DATA:  Pain. Nausea vomiting. History of previous diverticulitis EXAM: CT ABDOMEN AND PELVIS WITH CONTRAST TECHNIQUE: Multidetector CT imaging of the abdomen and pelvis was performed using the standard protocol following bolus administration of intravenous contrast. RADIATION DOSE REDUCTION: This exam was performed according to the departmental dose-optimization program which includes automated exposure control, adjustment of the mA and/or kV according to patient size and/or use of iterative reconstruction technique. CONTRAST:  174mL OMNIPAQUE IOHEXOL 300 MG/ML  SOLN COMPARISON:  CT 04/06/2020 FINDINGS: Lower chest: Lung bases are clear. No pleural effusion. Gynecomastia Hepatobiliary: No space-occupying liver lesion. Patent portal vein. Gallbladder is present. Pancreas: Preserved pancreatic parenchyma.  No enhancing lesion. Spleen: No space-occupying lesion.  The spleen is nonenlarged. Adrenals/Urinary Tract: The adrenal glands are preserved. No enhancing renal mass. Nonobstructing lower pole small left-sided renal stones. No collecting system dilatation. Preserved contours of the urinary bladder Stomach/Bowel: Large bowel is of normal course and caliber with scattered stool. Normal appendix extends  medial to the cecum in the central pelvis. The stomach is nondilated. Small bowel is nondilated. Slight wall thickening of the GE junction. Vascular/Lymphatic: Normal caliber aorta and IVC. No developing abnormal lymph node enlargement present in the abdomen and pelvis. Reproductive: Prostate is unremarkable. Other: No abdominal wall hernia or abnormality. No abdominopelvic ascites. Musculoskeletal: Intramedullary rod along the right femur at the edge of the imaging field. Mild degenerative changes. IMPRESSION: No bowel obstruction, free air or free fluid. Scattered stool with normal appendix. Nonobstructing left-sided renal stones. Slight wall thickening at the GE junction, nonspecific. Please correlate with any specific symptoms. Electronically Signed   By: Jill Side M.D.   On: 07/23/2022 16:59    Procedures Procedures    Medications Ordered in ED Medications  sodium chloride 0.9 %  bolus 1,000 mL (0 mLs Intravenous Stopped 07/23/22 1752)  ondansetron (ZOFRAN) injection 4 mg (4 mg Intravenous Given 07/23/22 1603)  morphine (PF) 4 MG/ML injection 4 mg (4 mg Intravenous Given 07/23/22 1604)  iohexol (OMNIPAQUE) 300 MG/ML solution 100 mL (100 mLs Intravenous Contrast Given 07/23/22 1614)  alum & mag hydroxide-simeth (MAALOX/MYLANTA) 200-200-20 MG/5ML suspension 30 mL (30 mLs Oral Given 07/23/22 1808)  famotidine (PEPCID) IVPB 20 mg premix (0 mg Intravenous Stopped 07/23/22 1838)    ED Course/ Medical Decision Making/ A&P Clinical Course as of 07/23/22 2059  Wed Jul 23, 2022  1636 Pt re-evaluated and noted to have mild improvement of symptoms with treatment regimen in the ED.  [SB]  1733 Discussed with patient lab and CT scan results.  [SB]  1845 Discussed with patient and wife regarding discharge treatment plan as well as lab and CT findings.  Answered all available questions.  Patient appears safe for discharge at this time. [SB]    Clinical Course User Index [SB] Pearlene Teat A, PA-C                              Medical Decision Making Amount and/or Complexity of Data Reviewed Labs: ordered. Radiology: ordered.  Risk OTC drugs. Prescription drug management.   Patient presents to the emergency department with generalized abdominal pain x 3 days.  Patient has a history of diverticulitis. Has been evaluated by a GI doctor before, unsure of name. On exam patient with Diffuse abdominal TTP.  Remainder of exam without acute findings.  Pt afebrile. Differential diagnosis includes diverticulitis, pancreatitis, appendicitis, GERD, constipation.  Labs:  I ordered, and personally interpreted labs.  The pertinent results include:  Lipase at 48 CBC without leukocytosis CMP with slightly elevated glucose at 124, total bili elevated at 2.5, stable from previous Urinalysis without concerns for acute cystitis  Imaging: I ordered imaging studies including CT abdomen pelvis with I independently visualized and interpreted imaging which showed  No bowel obstruction, free air or free fluid. Scattered stool with  normal appendix.    Nonobstructing left-sided renal stones.    Slight wall thickening at the GE junction, nonspecific. Please  correlate with any specific symptoms.   I agree with the radiologist interpretation  Medications:  I ordered medication including IV, Zofran, morphine, Pepcid, GI cocktail for symptom management.  Reevaluation of the patient after these medicines and interventions, I reevaluated the patient and found that they have improved I have reviewed the patients home medicines and have made adjustments as needed   Disposition: Presentation suspicious for generalized abdominal pain as well as GERD.  Doubt concerns at this time for constipation, bowel obstruction, diverticulitis, pancreatitis, appendicitis. After consideration of the diagnostic results and the patients response to treatment, I feel that the patient would benefit from Discharge home.  Patient will be  discharged home with a prescription for Zofran, Bentyl, Carafate, Prilosec. Will provide information for on-call Gastroenterologist. Instructed pt to call and set up follow up appointment regarding todays ED visit with the gastroenterologist. Supportive care measures and strict return precautions discussed with patient at bedside. Pt acknowledges and verbalizes understanding. Pt appears safe for discharge. Follow up as indicated in discharge paperwork.   This chart was dictated using voice recognition software, Dragon. Despite the best efforts of this provider to proofread and correct errors, errors may still occur which can change documentation meaning.   Final Clinical Impression(s) / ED Diagnoses Final diagnoses:  Generalized abdominal pain  Nausea and vomiting, unspecified vomiting type    Rx / DC Orders ED Discharge Orders          Ordered    omeprazole (PRILOSEC) 20 MG capsule  Daily        07/23/22 1909    ondansetron (ZOFRAN) 4 MG tablet  Every 6 hours        07/23/22 1909    dicyclomine (BENTYL) 20 MG tablet  2 times daily        07/23/22 1909    sucralfate (CARAFATE) 1 GM/10ML suspension  3 times daily with meals & bedtime        07/23/22 1912              Yamil Oelke A, PA-C 07/23/22 2100    Charlynne Pander, MD 07/23/22 2252

## 2022-07-23 NOTE — ED Triage Notes (Signed)
Pt c/o generalized abd cramping x 2d; hx of diverticulitis; +NV

## 2022-07-23 NOTE — Discharge Instructions (Addendum)
It was a pleasure taking care of you today!  Your labs did not show any concerning findings today.  Your CT scan did not show any concerning emergent findings at this time.  You will be sent a prescription for Prilosec, Carafate, Zofran, Bentyl, take as directed.  Ensure to maintain fluid intake with water, tea, broth, soup, Pedialyte, Gatorade.  Ensure that you are continuing with a bland diet for the next couple of days.  Ensure to continue with small sips and small bites.  If you are experiencing nausea, you may take the Zofran and then wait 30 minutes until you attempt small bites and small sips of food or fluid.  Follow-up with your care team as needed.  Attached is information for the GI doctor to follow-up as needed.  Return to the emergency department if you experience increasing/worsening symptoms.

## 2022-07-23 NOTE — ED Notes (Signed)
D/c paperwork reviewed with pt, including prescriptions and follow up care.  No questions or concerns voiced at time of d/c. . Pt verbalized understanding, Ambulatory with family to ED exit, NAD.   

## 2022-07-27 ENCOUNTER — Emergency Department (HOSPITAL_BASED_OUTPATIENT_CLINIC_OR_DEPARTMENT_OTHER)
Admission: EM | Admit: 2022-07-27 | Discharge: 2022-07-27 | Disposition: A | Discharge status: Home / Self Care | Payer: Managed Care, Other (non HMO) | Attending: Emergency Medicine | Admitting: Emergency Medicine

## 2022-07-27 ENCOUNTER — Other Ambulatory Visit: Payer: Self-pay

## 2022-07-27 ENCOUNTER — Encounter (HOSPITAL_BASED_OUTPATIENT_CLINIC_OR_DEPARTMENT_OTHER): Payer: Self-pay | Admitting: Emergency Medicine

## 2022-07-27 DIAGNOSIS — R112 Nausea with vomiting, unspecified: Secondary | ICD-10-CM | POA: Diagnosis present

## 2022-07-27 DIAGNOSIS — E1165 Type 2 diabetes mellitus with hyperglycemia: Secondary | ICD-10-CM | POA: Insufficient documentation

## 2022-07-27 DIAGNOSIS — R1084 Generalized abdominal pain: Secondary | ICD-10-CM | POA: Diagnosis not present

## 2022-07-27 DIAGNOSIS — Z7984 Long term (current) use of oral hypoglycemic drugs: Secondary | ICD-10-CM | POA: Insufficient documentation

## 2022-07-27 LAB — URINALYSIS, ROUTINE W REFLEX MICROSCOPIC
Bilirubin Urine: NEGATIVE
Glucose, UA: NEGATIVE mg/dL
Ketones, ur: NEGATIVE mg/dL
Nitrite: NEGATIVE
Protein, ur: NEGATIVE mg/dL
Specific Gravity, Urine: 1.015 (ref 1.005–1.030)
pH: 7 (ref 5.0–8.0)

## 2022-07-27 LAB — COMPREHENSIVE METABOLIC PANEL
ALT: 16 U/L (ref 0–44)
AST: 16 U/L (ref 15–41)
Albumin: 3.9 g/dL (ref 3.5–5.0)
Alkaline Phosphatase: 63 U/L (ref 38–126)
Anion gap: 10 (ref 5–15)
BUN: 10 mg/dL (ref 6–20)
CO2: 24 mmol/L (ref 22–32)
Calcium: 8.8 mg/dL — ABNORMAL LOW (ref 8.9–10.3)
Chloride: 99 mmol/L (ref 98–111)
Creatinine, Ser: 0.98 mg/dL (ref 0.61–1.24)
GFR, Estimated: >60 mL/min (ref >60)
Glucose, Bld: 139 mg/dL — ABNORMAL HIGH (ref 70–99)
Potassium: 3.5 mmol/L (ref 3.5–5.1)
Sodium: 133 mmol/L — ABNORMAL LOW (ref 135–145)
Total Bilirubin: 1.7 mg/dL — ABNORMAL HIGH (ref 0.3–1.2)
Total Protein: 7.8 g/dL (ref 6.5–8.1)

## 2022-07-27 LAB — CBC
HCT: 43.4 % (ref 39.0–52.0)
Hemoglobin: 15.3 g/dL (ref 13.0–17.0)
MCH: 27.6 pg (ref 26.0–34.0)
MCHC: 35.3 g/dL (ref 30.0–36.0)
MCV: 78.3 fL — ABNORMAL LOW (ref 80.0–100.0)
Platelets: 290 10*3/uL (ref 150–400)
RBC: 5.54 MIL/uL (ref 4.22–5.81)
RDW: 11.9 % (ref 11.5–15.5)
WBC: 5 10*3/uL (ref 4.0–10.5)
nRBC: 0 % (ref 0.0–0.2)

## 2022-07-27 LAB — CBG MONITORING, ED: Glucose-Capillary: 124 mg/dL — ABNORMAL HIGH (ref 70–99)

## 2022-07-27 LAB — LIPASE, BLOOD: Lipase: 51 U/L (ref 11–51)

## 2022-07-27 LAB — URINALYSIS, MICROSCOPIC (REFLEX)

## 2022-07-27 MED ORDER — ONDANSETRON HCL 4 MG/2ML IJ SOLN
4.0000 mg | Freq: Once | INTRAMUSCULAR | Status: AC
  Administered 2022-07-27: 4 mg via INTRAVENOUS
  Filled 2022-07-27: qty 2

## 2022-07-27 MED ORDER — SODIUM CHLORIDE 0.9 % IV BOLUS
1000.0000 mL | Freq: Once | INTRAVENOUS | Status: AC
  Administered 2022-07-27: 1000 mL via INTRAVENOUS

## 2022-07-27 MED ORDER — ALUM & MAG HYDROXIDE-SIMETH 200-200-20 MG/5ML PO SUSP
30.0000 mL | Freq: Once | ORAL | Status: AC
  Administered 2022-07-27: 30 mL via ORAL
  Filled 2022-07-27: qty 30

## 2022-07-27 MED ORDER — PANTOPRAZOLE SODIUM 40 MG IV SOLR
40.0000 mg | Freq: Once | INTRAVENOUS | Status: AC
  Administered 2022-07-27: 40 mg via INTRAVENOUS
  Filled 2022-07-27: qty 10

## 2022-07-27 MED ORDER — METOCLOPRAMIDE HCL 10 MG PO TABS: 10.0000 mg | ORAL_TABLET | Freq: Four times a day (QID) | ORAL | 0 refills | Status: AC

## 2022-07-27 MED ORDER — MORPHINE SULFATE (PF) 4 MG/ML IV SOLN
4.0000 mg | Freq: Once | INTRAVENOUS | Status: AC
  Administered 2022-07-27: 4 mg via INTRAVENOUS
  Filled 2022-07-27: qty 1

## 2022-07-27 NOTE — ED Notes (Signed)
D/c paperwork reviewed with pt, including prescriptions and follow up care.  No questions or concerns voiced at time of d/c. . Pt verbalized understanding, Ambulatory with family to ED exit, NAD.   

## 2022-07-27 NOTE — Discharge Instructions (Signed)
You were seen for nausea and vomiting. I have referred you to a gastroenterologist at [facility]. Please call in 2 days if you do not hear from them for an appointment. I have prescribed you Reglan which is a nausea medication you can use in place of the ondansetron. Please eat small meals. Please return for worsening symptoms.

## 2022-07-27 NOTE — ED Triage Notes (Signed)
Persistent emesis , consistent abd cramping . Was seen on 07/23/22 for similar symptoms . Hx diabetes , was diagnosed with diverticulitis he said .

## 2022-07-27 NOTE — ED Notes (Signed)
Pt provided graham crackers, peanut butter, apple juice for PO challenge.

## 2022-07-27 NOTE — ED Provider Notes (Signed)
[facility] EMERGENCY DEPARTMENT AT [facility] Provider Note   CSN: [id number] Arrival date & time: 07/27/22  1140     History  Chief Complaint  Patient presents with   Emesis    Luis Orozco is a 37 y.o. male.  With past medical history of diverticulitis, diabetes who presents to the emergency department with emesis.  Patient states that he has had ongoing abdominal pain, nausea and vomiting.  He states that he presented to the emergency department on 07/23/2022 with the same symptoms.  He describes having diffuse abdominal pain but points mostly to the left abdomen that he describes as being kicked in the stomach.  He states that the pain is persistent.  He has had associated nausea and vomiting without hematemesis.  He has no unable to tolerate p.o. over the last few days.  After being seen in the emergency department on the 17th he states his symptoms did not improve and have worsened.  He denies having any fever, diarrhea, hematochezia, melena, dysuria, penile discharge.  He denies previous abdominal surgeries.  He denies frequent NSAID, aspirin, Tylenol use, alcohol use, marijuana use.     Emesis Associated symptoms: abdominal pain        Home Medications Prior to Admission medications   Medication Sig Start Date End Date Taking? Authorizing Provider  metoCLOPramide (REGLAN) 10 MG tablet Take 1 tablet (10 mg total) by mouth every 6 (six) hours. 07/27/22  Yes Mickie Hillier, PA-C  dicyclomine (BENTYL) 20 MG tablet Take 1 tablet (20 mg total) by mouth 2 (two) times daily. 07/23/22   Blue, Soijett A, PA-C  Efinaconazole (JUBLIA) 10 % SOLN Apply 1 drop topically Nightly. 04/13/13   Sheard, Myeong O, DPM  famotidine (PEPCID) 20 MG tablet Take 1 tablet (20 mg total) by mouth 2 (two) times daily for 14 days. 04/07/20 04/21/20  Couture, Cortni S, PA-C  GREEN TEA, CAMILLIA SINENSIS, PO Take 1 tablet by mouth daily before lunch.    [provider]  metFORMIN  (GLUCOPHAGE) 500 MG tablet Take 500 mg by mouth 2 (two) times daily with a meal.    [provider]  omeprazole (PRILOSEC) 20 MG capsule Take 1 capsule (20 mg total) by mouth daily. 07/23/22 08/22/22  Blue, Soijett A, PA-C  ondansetron (ZOFRAN ODT) 8 MG disintegrating tablet Take 1 tablet (8 mg total) by mouth every 8 (eight) hours as needed for nausea or vomiting. 09/19/20   Molpus, John, MD  ondansetron (ZOFRAN) 4 MG tablet Take 1 tablet (4 mg total) by mouth every 6 (six) hours. 07/23/22   Blue, Soijett A, PA-C  PROAIR HFA 108 (90 BASE) MCG/ACT inhaler  08/14/14   [provider]  sucralfate (CARAFATE) 1 GM/10ML suspension Take 10 mLs (1 g total) by mouth 4 (four) times daily -  with meals and at bedtime. 07/23/22   Blue, Soijett A, PA-C      Allergies    Patient has no known allergies.    Review of Systems   Review of Systems  Constitutional:  Positive for appetite change.  Gastrointestinal:  Positive for abdominal pain, nausea and vomiting.  All other systems reviewed and are negative.   Physical Exam Updated Vital Signs BP (!) 145/103   Pulse 82   Temp 98.4 F (36.9 C) (Oral)   Resp 15   SpO2 100%  Physical Exam Vitals and nursing note reviewed.  Constitutional:      General: He is not in acute distress.  Appearance: He is ill-appearing. He is not toxic-appearing.  HENT:     Head: Normocephalic.     Mouth/Throat:     Mouth: Mucous membranes are moist.     Pharynx: Oropharynx is clear.  Eyes:     General: No scleral icterus.    Extraocular Movements: Extraocular movements intact.  Cardiovascular:     Rate and Rhythm: Normal rate and regular rhythm.     Pulses: Normal pulses.     Heart sounds: No murmur heard. Pulmonary:     Effort: Pulmonary effort is normal. No respiratory distress.     Breath sounds: Normal breath sounds.  Abdominal:     General: Abdomen is protuberant. Bowel sounds are normal. There is no distension.     Palpations: Abdomen is  soft.     Tenderness: There is generalized abdominal tenderness. There is guarding. There is no right CVA tenderness, left CVA tenderness or rebound. Negative signs include McBurney's sign.  Musculoskeletal:        General: Normal range of motion.     Cervical back: Neck supple.  Skin:    General: Skin is warm and dry.     Capillary Refill: Capillary refill takes less than 2 seconds.  Neurological:     General: No focal deficit present.     Mental Status: He is alert and oriented to person, place, and time. Mental status is at baseline.  Psychiatric:        Mood and Affect: Mood normal.        Behavior: Behavior normal.        Thought Content: Thought content normal.        Judgment: Judgment normal.     ED Results / Procedures / Treatments   Labs (all labs ordered are listed, but only abnormal results are displayed) Labs Reviewed  COMPREHENSIVE METABOLIC PANEL - Abnormal; Notable for the following components:      Result Value   Sodium 133 (*)    Glucose, Bld 139 (*)    Calcium 8.8 (*)    Total Bilirubin 1.7 (*)    All other components within normal limits  CBC - Abnormal; Notable for the following components:   MCV 78.3 (*)    All other components within normal limits  URINALYSIS, ROUTINE W REFLEX MICROSCOPIC - Abnormal; Notable for the following components:   APPearance HAZY (*)    Hgb urine dipstick TRACE (*)    Leukocytes,Ua TRACE (*)    All other components within normal limits  URINALYSIS, MICROSCOPIC (REFLEX) - Abnormal; Notable for the following components:   Bacteria, UA MANY (*)    All other components within normal limits  CBG MONITORING, ED - Abnormal; Notable for the following components:   Glucose-Capillary 124 (*)    All other components within normal limits  LIPASE, BLOOD    EKG None  Radiology No results found.  Procedures Procedures   Medications Ordered in ED Medications  sodium chloride 0.9 % bolus 1,000 mL (0 mLs Intravenous Stopped  07/27/22 1551)  ondansetron (ZOFRAN) injection 4 mg (4 mg Intravenous Given 07/27/22 1404)  morphine (PF) 4 MG/ML injection 4 mg (4 mg Intravenous Given 07/27/22 1507)  alum & mag hydroxide-simeth (MAALOX/MYLANTA) 200-200-20 MG/5ML suspension 30 mL (30 mLs Oral Given 07/27/22 1547)  pantoprazole (PROTONIX) injection 40 mg (40 mg Intravenous Given 07/27/22 1546)    ED Course/ Medical Decision Making/ A&P   {    Medical Decision Making Amount and/or Complexity of Data Reviewed Labs: ordered.  Risk OTC drugs. Prescription drug management.  Initial Impression and Ddx 37 year old male who presents to the emergency department with diffuse abdominal pain, nausea and vomiting. On initial exam he is uncomfortable appearing. Generalized abdominal pain.  Patient PMH that increases complexity of ED encounter:  type 2 diabetes Differential: Acute hepatobiliary disease, pancreatitis, appendicitis, PUD, gastritis, SBO, diverticulitis, colitis, viral gastroenteritis, Crohn's, UC, vascular catastrophe, UTI, pyelonephritis, renal stone, obstructed stone, infected stone, testicular torsion, epididymitis, incarcerated hernia, STD, etc.    Interpretation of Diagnostics I independent reviewed and interpreted the labs as followed: cmp w/o electrolyte derangement, no leukocytosis, ua with trace leuks asymptomatic, doubt UTI, lipase negative  - I independently visualized the following imaging with scope of interpretation limited to determining acute life threatening conditions related to emergency care: not indicated  Patient Reassessment and Ultimate Disposition/Management On reassessment after zofran, nausea improved, continues to have abdominal pain so was dose with morphine. Abdominal pain further improved with morphine. I also gave him 1L IVF and GI cocktail, protonix which has resolved symptoms.  Abdomen is no longer tender to palpation. Tolerated PO trial here.  He was just here 4 days ago with same  symptoms and had CT imaging which was negative, will no repeat imaging at this time.  I suspect his symptoms may be related to gastroparesis r/t diabetes?   Symptoms inconsistent with acute hepatobiliary disease. Lipase negative, doubt pancreatitis. Symptoms inconsistent with appendicitis, PUD, SBO, diverticulitis, vascular catastrophe, stone, obstructed stone, infected stone, torsion, epididymitis, hernia.  His UA with mild leuks, no urinary symptoms. Doubt UTI at this time, will not treat.  Will refer him to gastroenterology. Will switch his Zofran to Reglan if this indeed is gastroparesis as it may be more effective. Given return precautions for worsening symptoms.   The patient has been appropriately medically screened and/or stabilized in the ED. I have low suspicion for any other emergent medical condition which would require further screening, evaluation or treatment in the ED or require inpatient management. At time of discharge the patient is hemodynamically stable and in no acute distress. I have discussed work-up results and diagnosis with patient and answered all questions. Patient is agreeable with discharge plan. We discussed strict return precautions for returning to the emergency department and they verbalized understanding.     Patient management required discussion with the following services or consulting groups:  None  Complexity of Problems Addressed Acute complicated illness or Injury  Additional Data Reviewed and Analyzed Further history obtained from: Further history from spouse/family member, Past medical history and medications listed in the EMR, Prior ED visit notes, Care Everywhere, and Prior labs/imaging results  Patient Encounter Risk Assessment Prescriptions, SDOH impact on management, and Consideration of hospitalization  Final Clinical Impression(s) / ED Diagnoses Final diagnoses:  Nausea and vomiting, unspecified vomiting type    Rx / DC Orders ED  Discharge Orders          Ordered    metoCLOPramide (REGLAN) 10 MG tablet  Every 6 hours        07/27/22 1731    Ambulatory referral to Gastroenterology        07/27/22 1731              Cristopher Peru, PA-C 07/30/22 0651    Franne Forts, DO 07/31/22 [phone]

## 2023-03-20 ENCOUNTER — Other Ambulatory Visit: Payer: Self-pay

## 2023-03-20 ENCOUNTER — Encounter (HOSPITAL_BASED_OUTPATIENT_CLINIC_OR_DEPARTMENT_OTHER): Payer: Self-pay | Admitting: Emergency Medicine

## 2023-03-20 ENCOUNTER — Emergency Department (HOSPITAL_BASED_OUTPATIENT_CLINIC_OR_DEPARTMENT_OTHER): Payer: Managed Care, Other (non HMO)

## 2023-03-20 ENCOUNTER — Emergency Department (HOSPITAL_BASED_OUTPATIENT_CLINIC_OR_DEPARTMENT_OTHER)
Admission: EM | Admit: 2023-03-20 | Discharge: 2023-03-21 | Disposition: A | Discharge status: Home / Self Care | Payer: Managed Care, Other (non HMO) | Attending: Emergency Medicine | Admitting: Emergency Medicine

## 2023-03-20 DIAGNOSIS — J019 Acute sinusitis, unspecified: Secondary | ICD-10-CM | POA: Insufficient documentation

## 2023-03-20 DIAGNOSIS — I10 Essential (primary) hypertension: Secondary | ICD-10-CM | POA: Diagnosis not present

## 2023-03-20 DIAGNOSIS — E119 Type 2 diabetes mellitus without complications: Secondary | ICD-10-CM | POA: Diagnosis not present

## 2023-03-20 DIAGNOSIS — Z79899 Other long term (current) drug therapy: Secondary | ICD-10-CM | POA: Diagnosis not present

## 2023-03-20 DIAGNOSIS — H02843 Edema of right eye, unspecified eyelid: Secondary | ICD-10-CM | POA: Diagnosis present

## 2023-03-20 DIAGNOSIS — D72829 Elevated white blood cell count, unspecified: Secondary | ICD-10-CM | POA: Insufficient documentation

## 2023-03-20 LAB — CBC WITH DIFFERENTIAL/PLATELET
Abs Immature Granulocytes: 0.2 10*3/uL — ABNORMAL HIGH (ref 0.00–0.07)
Basophils Absolute: 0.1 10*3/uL (ref 0.0–0.1)
Basophils Relative: 1 %
Eosinophils Absolute: 0.1 10*3/uL (ref 0.0–0.5)
Eosinophils Relative: 1 %
HCT: 39.3 % (ref 39.0–52.0)
Hemoglobin: 13.6 g/dL (ref 13.0–17.0)
Immature Granulocytes: 1 %
Lymphocytes Relative: 23 %
Lymphs Abs: 4.3 10*3/uL — ABNORMAL HIGH (ref 0.7–4.0)
MCH: 27.5 pg (ref 26.0–34.0)
MCHC: 34.6 g/dL (ref 30.0–36.0)
MCV: 79.6 fL — ABNORMAL LOW (ref 80.0–100.0)
Monocytes Absolute: 1.7 10*3/uL — ABNORMAL HIGH (ref 0.1–1.0)
Monocytes Relative: 9 %
Neutro Abs: 12.2 10*3/uL — ABNORMAL HIGH (ref 1.7–7.7)
Neutrophils Relative %: 65 %
Platelets: 337 10*3/uL (ref 150–400)
RBC: 4.94 MIL/uL (ref 4.22–5.81)
RDW: 12 % (ref 11.5–15.5)
WBC: 18.6 10*3/uL — ABNORMAL HIGH (ref 4.0–10.5)
nRBC: 0 % (ref 0.0–0.2)

## 2023-03-20 LAB — BASIC METABOLIC PANEL
Anion gap: 10 (ref 5–15)
BUN: 10 mg/dL (ref 6–20)
CO2: 25 mmol/L (ref 22–32)
Calcium: 9.2 mg/dL (ref 8.9–10.3)
Chloride: 97 mmol/L — ABNORMAL LOW (ref 98–111)
Creatinine, Ser: 0.82 mg/dL (ref 0.61–1.24)
GFR, Estimated: >60 mL/min (ref >60)
Glucose, Bld: 290 mg/dL — ABNORMAL HIGH (ref 70–99)
Potassium: 4.1 mmol/L (ref 3.5–5.1)
Sodium: 132 mmol/L — ABNORMAL LOW (ref 135–145)

## 2023-03-20 MED ORDER — IOHEXOL 300 MG/ML  SOLN
75.0000 mL | Freq: Once | INTRAMUSCULAR | Status: AC | PRN
  Administered 2023-03-20: 75 mL via INTRAVENOUS

## 2023-03-20 MED ORDER — AMLODIPINE BESYLATE 5 MG PO TABS: 5.0000 mg | ORAL_TABLET | Freq: Every day | ORAL | 0 refills | Status: AC

## 2023-03-20 NOTE — ED Triage Notes (Signed)
Patient seen and dx with sinus infection last week and prescribed antibiotics. Patient states that his right eye has been swollen and painful since Tuesday and has gotten better but is still persisting despite antibiotics and otc medicaitons. Endorses congestion.

## 2023-03-20 NOTE — ED Provider Notes (Incomplete)
[place] [facility] DE[facility]MENT AT [facility] [place] Provider Note   CSN: [id number] Arrival date & time: 03/20/23  1915     History {Add pertinent medical, surgical, social history, OB history to HPI:1} Chief Complaint  Patient presents with  . Facial Swelling    Luis Orozco is a 37 y.o. male history of diabetes presenting with right eye swelling.  Patient states last week he had a sinus infection and went to the urgent care at [facility] was initially diagnosed with a viral sinusitis.  However symptoms persisted and every time patient leans forward his right frontal sinus is a dull ache to it and so he went back to [facility] and was given an antibiotic but is unsure of the name.  Patient stated that initially his symptoms were getting better starting on Tuesday when he started the antibiotic however the swelling has come back and patient is unsure why he is taking the antibiotics.  Patient does feel congested.  Patient denies any fevers, pain with extraocular movements, neck pain/stiffness, altered mental status, vision changes, new onset weakness, changes sensation/motor skills.  Home Medications Prior to Admission medications   Medication Sig Start Date End Date Taking? Authorizing Provider  dicyclomine (BENTYL) 20 MG tablet Take 1 tablet (20 mg total) by mouth 2 (two) times daily. 07/23/22   Blue, Soijett A, PA-C  Efinaconazole (JUBLIA) 10 % SOLN Apply 1 drop topically Nightly. 04/13/13   Sheard, Myeong O, DPM  famotidine (PEPCID) 20 MG tablet Take 1 tablet (20 mg total) by mouth 2 (two) times daily for 14 days. 04/07/20 04/21/20  Couture, Cortni S, PA-C  GREEN TEA, CAMILLIA SINENSIS, PO Take 1 tablet by mouth daily before lunch.    [provider]  metFORMIN (GLUCOPHAGE) 500 MG tablet Take 500 mg by mouth 2 (two) times daily with a meal.    [provider]  metoCLOPramide (REGLAN) 10 MG tablet Take 1 tablet (10 mg total) by mouth every 6 (six)  hours. 07/27/22   Cristopher Peru, PA-C  omeprazole (PRILOSEC) 20 MG capsule Take 1 capsule (20 mg total) by mouth daily. 07/23/22 08/22/22  Blue, Soijett A, PA-C  ondansetron (ZOFRAN ODT) 8 MG disintegrating tablet Take 1 tablet (8 mg total) by mouth every 8 (eight) hours as needed for nausea or vomiting. 09/19/20   Molpus, John, MD  ondansetron (ZOFRAN) 4 MG tablet Take 1 tablet (4 mg total) by mouth every 6 (six) hours. 07/23/22   Blue, Soijett A, PA-C  PROAIR HFA 108 (90 BASE) MCG/ACT inhaler  08/14/14   [provider]  sucralfate (CARAFATE) 1 GM/10ML suspension Take 10 mLs (1 g total) by mouth 4 (four) times daily -  with meals and at bedtime. 07/23/22   Blue, Soijett A, PA-C      Allergies    Patient has no known allergies.    Review of Systems   Review of Systems  Physical Exam Updated Vital Signs BP (!) 162/116   Pulse 99   Temp 99.5 F (37.5 C) (Oral)   Resp 18   Ht 6' (1.829 m)   Wt 106.6 kg   SpO2 98%   BMI 31.87 kg/m  Physical Exam Constitutional:      General: He is not in acute distress. HENT:     Head: Normocephalic.     Comments: No tenderness to palpation of sinuses    Right Ear: Tympanic membrane, ear canal and external ear normal.     Left Ear: Tympanic membrane,  ear canal and external ear normal.     Nose: Congestion present.     Mouth/Throat:     Mouth: Mucous membranes are moist.     Pharynx: No posterior oropharyngeal erythema.  Eyes:     Extraocular Movements: Extraocular movements intact.     Conjunctiva/sclera: Conjunctivae normal.     Pupils: Pupils are equal, round, and reactive to light.     Comments: Right eye does appear mildly swollen in the upper eyelid however there is no pain with extraocular movements and no skin discoloration noted, right eye is not warm to palpation  Musculoskeletal:     Cervical back: Normal range of motion and neck supple.  Neurological:     Mental Status: He is alert.  Psychiatric:        Mood and Affect: Mood  normal.     ED Results / Procedures / Treatments   Labs (all labs ordered are listed, but only abnormal results are displayed) Labs Reviewed  BASIC METABOLIC PANEL - Abnormal; Notable for the following components:      Result Value   Sodium 132 (*)    Chloride 97 (*)    Glucose, Bld 290 (*)    All other components within normal limits  CBC WITH DIFFERENTIAL/PLATELET - Abnormal; Notable for the following components:   WBC 18.6 (*)    MCV 79.6 (*)    Neutro Abs 12.2 (*)    Lymphs Abs 4.3 (*)    Monocytes Absolute 1.7 (*)    Abs Immature Granulocytes 0.20 (*)    All other components within normal limits    EKG None  Radiology No results found.  Procedures Procedures  {Document cardiac monitor, telemetry assessment procedure when appropriate:1}  Medications Ordered in ED Medications  iohexol (OMNIPAQUE) 300 MG/ML solution 75 mL (75 mLs Intravenous Contrast Given 03/20/23 2233)    ED Course/ Medical Decision Making/ A&P   {   Click here for ABCD2, HEART and other calculatorsREFRESH Note before signing :1}                              Medical Decision Making Amount and/or Complexity of Data Reviewed Labs: ordered. Radiology: ordered.  Risk Prescription drug management.   Aileen Pilot 37 y.o. presented today for right eye swelling. Working DDx that I considered at this time includes, but not limited to, periorbital/orbital cellulitis, sinusitis sepsis, electrolyte normalities, uncontrolled hypertension.  R/o DDx: periorbital/orbital cellulitis, sepsis, electrolyte normalities: These are considered less likely due to history of present illness, physical exam, labs/imaging findings  Review of prior external notes: 07/27/2022 ED  Unique Tests and My Interpretation:  CBC: Leukocytosis 18.6 BMP: Glucose 290 CT orbits with contrast: Significant mucus filling in the right sided sinuses however no periorbital orbital cellulitis  Discussion with Independent  Historian: None  Discussion of Management of Tests: None  Risk: Medium: prescription drug management  Risk Stratification Score: None  Plan: On exam patient was in no acute distress but was noted to be hypertensive 160/116 however patient states this is a normal blood pressure for him.  Patient was not endorsing any vision changes and no neurologic deficit and had reassuring physical exam and so at this time does not need a larger workup for his hypertension as this is most likely uncontrolled hypertension.  Anticipate discharge with blood pressure meds with primary care follow-up.  Patient did have swelling around his right eye and the upper eyelid but  otherwise had unremarkable physical exam.  Patient's labs from triage do so leukocytosis of 18.6 and currently pending CT orbits with contrast radiology read.  Anticipate discharge on antibiotics with primary care follow-up.  CT shows nearly full sinuses on the right side but no periorbital cellulitis or orbital cellulitis.  Triage note states that patient is on Augmentin however patient is unsure if that is the medication when I spoke with him.  Will place patient on Augmentin as he is unsure of the antibiotic he is on now and have him follow-up with an ENT specialist.  As per patient's asymptomatic hypertension will place on low-dose amlodipine and encouraged him to follow-up with a primary care provider for long-term management.  Patient given strict return precautions.  Patient was given return precautions. Patient stable for discharge at this time.  Patient verbalized understanding of plan.   {Document critical care time when appropriate:1} {Document review of labs and clinical decision tools ie heart score, Chads2Vasc2 etc:1}  {Document your independent review of radiology images, and any outside records:1} {Document your discussion with family members, caretakers, and with consultants:1} {Document social determinants of health affecting  pt's care:1} {Document your decision making why or why not admission, treatments were needed:1} Final Clinical Impression(s) / ED Diagnoses Final diagnoses:  None    Rx / DC Orders ED Discharge Orders     None

## 2023-03-20 NOTE — ED Notes (Signed)
Pt transported to imaging.

## 2023-03-20 NOTE — Discharge Instructions (Addendum)
Please follow-up with your primary care provider regarding recent symptoms and ER visit.  Today your labs and imaging show that you have a lot of mucus in your sinuses most likely causing your symptoms.  I have prescribed you Augmentin to take as an antibiotic and you will need to follow-up with the ENT specialist I have attached here for you.  Your blood pressure was also elevated today and you may have undiagnosed high blood pressure and so I have placed you on a low-dose blood pressure medication called amlodipine.  Please follow-up with your primary care provider for long-term management of this.  If you begin to have changes or worsening of symptoms please return to ER.

## 2023-03-20 NOTE — ED Provider Notes (Cosign Needed Addendum)
[place] [facility] [place] Provider [place]   CSN: [id number] Arrival date & time: 03/20/23  1915     History  Chief Complaint  Patient presents with   Facial Swelling    Luis Orozco is a 37 y.o. male history of diabetes presenting with right eye swelling.  Patient states last week he had a sinus infection and went to the urgent care at [facility] was initially diagnosed with a viral sinusitis.  However symptoms persisted and every time patient leans forward his right frontal sinus is a dull ache to it and so he went back to [facility] medical and was given an antibiotic but is unsure of the name.  Patient stated that initially his symptoms were getting better starting on Tuesday when he started the antibiotic however the swelling has come back and patient is unsure why he is taking the antibiotics.  Patient does feel congested.  Patient denies any fevers, pain with extraocular movements, neck pain/stiffness, altered mental status, vision changes, new onset weakness, changes sensation/motor skills.  Home Medications Prior to Admission medications   Medication Sig Start Date End Date Taking? Authorizing Provider  amLODipine (NORVASC) 5 MG tablet Take 1 tablet (5 mg total) by mouth daily. 03/20/23  Yes Evlyn Kanner T, PA-C  amoxicillin-clavulanate (AUGMENTIN) 875-125 MG tablet Take 1 tablet by mouth every 12 (twelve) hours. 03/21/23  Yes Susette Seminara, Beverly Gust, PA-C  dicyclomine (BENTYL) 20 MG tablet Take 1 tablet (20 mg total) by mouth 2 (two) times daily. 07/23/22   Blue, Soijett A, PA-C  Efinaconazole (JUBLIA) 10 % SOLN Apply 1 drop topically Nightly. 04/13/13   Sheard, Myeong O, DPM  famotidine (PEPCID) 20 MG tablet Take 1 tablet (20 mg total) by mouth 2 (two) times daily for 14 days. 04/07/20 04/21/20  Couture, Cortni S, PA-C  GREEN TEA, CAMILLIA SINENSIS, PO Take 1 tablet by mouth daily before lunch.    [provider]  metFORMIN (GLUCOPHAGE) 500 MG  tablet Take 500 mg by mouth 2 (two) times daily with a meal.    [provider]  metoCLOPramide (REGLAN) 10 MG tablet Take 1 tablet (10 mg total) by mouth every 6 (six) hours. 07/27/22   Cristopher Peru, PA-C  omeprazole (PRILOSEC) 20 MG capsule Take 1 capsule (20 mg total) by mouth daily. 07/23/22 08/22/22  Blue, Soijett A, PA-C  ondansetron (ZOFRAN ODT) 8 MG disintegrating tablet Take 1 tablet (8 mg total) by mouth every 8 (eight) hours as needed for nausea or vomiting. 09/19/20   Molpus, John, MD  ondansetron (ZOFRAN) 4 MG tablet Take 1 tablet (4 mg total) by mouth every 6 (six) hours. 07/23/22   Blue, Soijett A, PA-C  PROAIR HFA 108 (90 BASE) MCG/ACT inhaler  08/14/14   [provider]  sucralfate (CARAFATE) 1 GM/10ML suspension Take 10 mLs (1 g total) by mouth 4 (four) times daily -  with meals and at bedtime. 07/23/22   Blue, Soijett A, PA-C      Allergies    Patient has no known allergies.    Review of Systems   Review of Systems  Physical Exam Updated Vital Signs BP (!) 186/111   Pulse 84   Temp (!) 97.4 F (36.3 C) (Oral)   Resp 18   Ht 6' (1.829 m)   Wt 106.6 kg   SpO2 98%   BMI 31.87 kg/m  Physical Exam Constitutional:      General: He is not in acute distress.  Comments: Resting comfortably on his phone  HENT:     Head: Normocephalic.     Comments: No tenderness to palpation of sinuses    Right Ear: Tympanic membrane, ear canal and external ear normal.     Left Ear: Tympanic membrane, ear canal and external ear normal.     Nose: Congestion present.     Mouth/Throat:     Mouth: Mucous membranes are moist.     Pharynx: No posterior oropharyngeal erythema.  Eyes:     Extraocular Movements: Extraocular movements intact.     Conjunctiva/sclera: Conjunctivae normal.     Pupils: Pupils are equal, round, and reactive to light.     Comments: Right eye does appear mildly swollen in the upper eyelid however there is no pain with extraocular movements and no  skin discoloration noted, right eye is not warm to palpation  Musculoskeletal:     Cervical back: Normal range of motion and neck supple.  Skin:    General: Skin is warm and dry.     Comments: No overlying skin changes  Neurological:     General: No focal deficit present.     Mental Status: He is alert.  Psychiatric:        Mood and Affect: Mood normal.     ED Results / Procedures / Treatments   Labs (all labs ordered are listed, but only abnormal results are displayed) Labs Reviewed  BASIC METABOLIC PANEL - Abnormal; Notable for the following components:      Result Value   Sodium 132 (*)    Chloride 97 (*)    Glucose, Bld 290 (*)    All other components within normal limits  CBC WITH DIFFERENTIAL/PLATELET - Abnormal; Notable for the following components:   WBC 18.6 (*)    MCV 79.6 (*)    Neutro Abs 12.2 (*)    Lymphs Abs 4.3 (*)    Monocytes Absolute 1.7 (*)    Abs Immature Granulocytes 0.20 (*)    All other components within normal limits    EKG None  Radiology CT Orbits W Contrast  Result Date: 03/20/2023 CLINICAL DATA:  Periorbital cellulitis EXAM: CT ORBITS WITH CONTRAST TECHNIQUE: Multidetector CT images was performed according to the standard protocol following intravenous contrast administration. RADIATION DOSE REDUCTION: This exam was performed according to the departmental dose-optimization program which includes automated exposure control, adjustment of the mA and/or kV according to patient size and/or use of iterative reconstruction technique. CONTRAST:  75mL OMNIPAQUE IOHEXOL 300 MG/ML  SOLN COMPARISON:  None Available. FINDINGS: Orbits: No orbital mass or evidence of inflammation. Normal appearance of the globes, optic nerve-sheath complexes, extraocular muscles, orbital fat and lacrimal glands. Visible paranasal sinuses: Near complete opacification of the right ethmoid and maxillary sinuses. Mild bilateral frontal sinus mucosal thickening. Soft tissues: Normal.  Osseous: No fracture or aggressive lesion. Limited intracranial: No acute or significant finding. IMPRESSION: 1. Near complete opacification of the right ethmoid and maxillary sinuses. Mild bilateral frontal sinus mucosal thickening. 2. No evidence of orbital or periorbital cellulitis. Electronically Signed   By: Deatra Robinson M.D.   On: 03/20/2023 23:55    Procedures Procedures    Medications Ordered in ED Medications  iohexol (OMNIPAQUE) 300 MG/ML solution 75 mL (75 mLs Intravenous Contrast Given 03/20/23 2233)    ED Course/ Medical Decision Making/ A&P  Medical Decision Making Amount and/or Complexity of Data Reviewed Labs: ordered. Radiology: ordered.  Risk Prescription drug management.   Aileen Pilot 37 y.o. presented today for right eye swelling. Working DDx that I considered at this time includes, but not limited to, periorbital/orbital cellulitis, sinusitis sepsis, electrolyte normalities, uncontrolled hypertension.  R/o DDx: periorbital/orbital cellulitis, sepsis, electrolyte normalities: These are considered less likely due to history of present illness, physical exam, labs/imaging findings  Review of prior external notes: 07/27/2022 ED  Unique Tests and My Interpretation:  CBC: Leukocytosis 18.6 BMP: Glucose 290 CT orbits with contrast: Significant mucus filling in the right sided sinuses however no periorbital orbital cellulitis  Discussion with Independent Historian: None  Discussion of Management of Tests: None  Risk: Medium: prescription drug management  Risk Stratification Score: None  Plan: On exam patient was in no acute distress but was noted to be hypertensive 160/116 however patient states this is a normal blood pressure for him.  Patient was not endorsing any vision changes and no neurologic deficit and had reassuring physical exam and so at this time does not need a larger workup for his hypertension as this is  most likely uncontrolled hypertension.  Anticipate discharge with blood pressure meds with primary care follow-up.  Patient did have swelling around his right eye and the upper eyelid but otherwise had unremarkable physical exam.  Patient's labs from triage do so leukocytosis of 18.6 and currently pending CT orbits with contrast radiology read.  Anticipate discharge on antibiotics with primary care follow-up.  CT shows nearly full sinuses on the right side but no periorbital cellulitis or orbital cellulitis which would explain his leukocytosis.  Patient was not endorsing fevers or other infectious symptoms at this time and so although his white count this time patient is stable to be treated in the outpatient setting. Furthermore, when I spoke to the patient the second time he said he received injections when he went to [facility] the second but does not recall what the medicine was. Given the high WBC and lacking infectious symptoms I suspect patient may have had steroid injections which would explain this. Triage note states that patient is on Augmentin however patient is unsure if that is the medication when I spoke with him.  Will place patient on Augmentin as he is unsure of the antibiotic he is on now and have him follow-up with an ENT specialist.  As per patient's asymptomatic hypertension will place on low-dose amlodipine and encouraged him to follow-up with a primary care provider for long-term management.  Patient given strict return precautions.  Patient was given return precautions. Patient stable for discharge at this time.  Patient verbalized understanding of plan.   Final Clinical Impression(s) / ED Diagnoses Final diagnoses:  Uncontrolled hypertension  Acute non-recurrent sinusitis, unspecified location    Rx / DC Orders ED Discharge Orders          Ordered    amoxicillin-clavulanate (AUGMENTIN) 875-125 MG tablet  Every 12 hours        03/21/23 0003    amLODipine (NORVASC) 5  MG tablet  Daily        03/20/23 2357              Netta Corrigan, PA-C 03/21/23 0908    Loetta Rough, MD 03/24/23 [phone]

## 2023-03-21 MED ORDER — AMOXICILLIN-POT CLAVULANATE 875-125 MG PO TABS: 1.0000 | ORAL_TABLET | Freq: Two times a day (BID) | ORAL | 0 refills | Status: DC

## 2024-01-08 ENCOUNTER — Emergency Department (HOSPITAL_BASED_OUTPATIENT_CLINIC_OR_DEPARTMENT_OTHER)

## 2024-01-08 ENCOUNTER — Other Ambulatory Visit: Payer: Self-pay

## 2024-01-08 ENCOUNTER — Encounter (HOSPITAL_BASED_OUTPATIENT_CLINIC_OR_DEPARTMENT_OTHER): Payer: Self-pay

## 2024-01-08 ENCOUNTER — Emergency Department (HOSPITAL_BASED_OUTPATIENT_CLINIC_OR_DEPARTMENT_OTHER)
Admission: EM | Admit: 2024-01-08 | Discharge: 2024-01-08 | Disposition: A | Discharge status: Home / Self Care | Attending: Emergency Medicine | Admitting: Emergency Medicine

## 2024-01-08 DIAGNOSIS — X58XXXA Exposure to other specified factors, initial encounter: Secondary | ICD-10-CM | POA: Diagnosis not present

## 2024-01-08 DIAGNOSIS — L089 Local infection of the skin and subcutaneous tissue, unspecified: Secondary | ICD-10-CM

## 2024-01-08 DIAGNOSIS — E1165 Type 2 diabetes mellitus with hyperglycemia: Secondary | ICD-10-CM | POA: Diagnosis not present

## 2024-01-08 DIAGNOSIS — Z79899 Other long term (current) drug therapy: Secondary | ICD-10-CM | POA: Insufficient documentation

## 2024-01-08 DIAGNOSIS — R739 Hyperglycemia, unspecified: Secondary | ICD-10-CM

## 2024-01-08 DIAGNOSIS — Y990 Civilian activity done for income or pay: Secondary | ICD-10-CM | POA: Insufficient documentation

## 2024-01-08 DIAGNOSIS — Z794 Long term (current) use of insulin: Secondary | ICD-10-CM | POA: Insufficient documentation

## 2024-01-08 DIAGNOSIS — L97529 Non-pressure chronic ulcer of other part of left foot with unspecified severity: Secondary | ICD-10-CM | POA: Diagnosis not present

## 2024-01-08 DIAGNOSIS — E11621 Type 2 diabetes mellitus with foot ulcer: Secondary | ICD-10-CM | POA: Diagnosis not present

## 2024-01-08 DIAGNOSIS — I10 Essential (primary) hypertension: Secondary | ICD-10-CM | POA: Diagnosis not present

## 2024-01-08 DIAGNOSIS — S91302A Unspecified open wound, left foot, initial encounter: Secondary | ICD-10-CM | POA: Diagnosis present

## 2024-01-08 HISTORY — DX: Essential (primary) hypertension: I10

## 2024-01-08 LAB — CBG MONITORING, ED
Glucose-Capillary: 140 mg/dL — ABNORMAL HIGH (ref 70–99)
Glucose-Capillary: 388 mg/dL — ABNORMAL HIGH (ref 70–99)
Glucose-Capillary: 430 mg/dL — ABNORMAL HIGH (ref 70–99)

## 2024-01-08 LAB — I-STAT VENOUS BLOOD GAS, ED
Acid-Base Excess: 1 mmol/L (ref 0.0–2.0)
Bicarbonate: 25.9 mmol/L (ref 20.0–28.0)
Calcium, Ion: 1.18 mmol/L (ref 1.15–1.40)
HCT: 31 % — ABNORMAL LOW (ref 39.0–52.0)
Hemoglobin: 10.5 g/dL — ABNORMAL LOW (ref 13.0–17.0)
O2 Saturation: 91 %
Patient temperature: 98.3
Potassium: 3.3 mmol/L — ABNORMAL LOW (ref 3.5–5.1)
Sodium: 139 mmol/L (ref 135–145)
TCO2: 27 mmol/L (ref 22–32)
pCO2, Ven: 42.9 mmHg — ABNORMAL LOW (ref 44–60)
pH, Ven: 7.389 (ref 7.25–7.43)
pO2, Ven: 62 mmHg — ABNORMAL HIGH (ref 32–45)

## 2024-01-08 LAB — COMPREHENSIVE METABOLIC PANEL WITH GFR
ALT: 19 U/L (ref 0–44)
AST: 16 U/L (ref 15–41)
Albumin: 3.9 g/dL (ref 3.5–5.0)
Alkaline Phosphatase: 96 U/L (ref 38–126)
Anion gap: 14 (ref 5–15)
BUN: 13 mg/dL (ref 6–20)
CO2: 24 mmol/L (ref 22–32)
Calcium: 9.3 mg/dL (ref 8.9–10.3)
Chloride: 98 mmol/L (ref 98–111)
Creatinine, Ser: 1.33 mg/dL — ABNORMAL HIGH (ref 0.61–1.24)
GFR, Estimated: >60 mL/min (ref >60)
Glucose, Bld: 470 mg/dL — ABNORMAL HIGH (ref 70–99)
Potassium: 3.7 mmol/L (ref 3.5–5.1)
Sodium: 135 mmol/L (ref 135–145)
Total Bilirubin: 0.9 mg/dL (ref 0.0–1.2)
Total Protein: 6 g/dL — ABNORMAL LOW (ref 6.5–8.1)

## 2024-01-08 LAB — LACTIC ACID, PLASMA
Lactic Acid, Venous: 3.2 mmol/L (ref 0.5–1.9)
Lactic Acid, Venous: 2.6 mmol/L (ref 0.5–1.9)
Lactic Acid, Venous: 1.8 mmol/L (ref 0.5–1.9)

## 2024-01-08 LAB — CBC WITH DIFFERENTIAL/PLATELET
Abs Immature Granulocytes: 0.03 K/uL (ref 0.00–0.07)
Basophils Absolute: 0.1 K/uL (ref 0.0–0.1)
Basophils Relative: 1 %
Eosinophils Absolute: 0.2 K/uL (ref 0.0–0.5)
Eosinophils Relative: 3 %
HCT: 34.4 % — ABNORMAL LOW (ref 39.0–52.0)
Hemoglobin: 12.4 g/dL — ABNORMAL LOW (ref 13.0–17.0)
Immature Granulocytes: 0 %
Lymphocytes Relative: 40 %
Lymphs Abs: 3.3 K/uL (ref 0.7–4.0)
MCH: 28.2 pg (ref 26.0–34.0)
MCHC: 36 g/dL (ref 30.0–36.0)
MCV: 78.4 fL — ABNORMAL LOW (ref 80.0–100.0)
Monocytes Absolute: 0.6 K/uL (ref 0.1–1.0)
Monocytes Relative: 7 %
Neutro Abs: 4.2 K/uL (ref 1.7–7.7)
Neutrophils Relative %: 49 %
Platelets: 258 K/uL (ref 150–400)
RBC: 4.39 MIL/uL (ref 4.22–5.81)
RDW: 12.2 % (ref 11.5–15.5)
WBC: 8.5 K/uL (ref 4.0–10.5)
nRBC: 0 % (ref 0.0–0.2)

## 2024-01-08 MED ORDER — DOXYCYCLINE HYCLATE 100 MG PO TABS
100.0000 mg | ORAL_TABLET | Freq: Once | ORAL | Status: AC
  Administered 2024-01-08: 100 mg via ORAL
  Filled 2024-01-08: qty 1

## 2024-01-08 MED ORDER — AMOXICILLIN-POT CLAVULANATE 875-125 MG PO TABS
1.0000 | ORAL_TABLET | Freq: Once | ORAL | Status: AC
  Administered 2024-01-08: 1 via ORAL
  Filled 2024-01-08: qty 1

## 2024-01-08 MED ORDER — SODIUM CHLORIDE 0.9 % IV BOLUS
1000.0000 mL | Freq: Once | INTRAVENOUS | Status: AC
  Administered 2024-01-08: 1000 mL via INTRAVENOUS

## 2024-01-08 MED ORDER — AMOXICILLIN-POT CLAVULANATE 875-125 MG PO TABS: 1.0000 | ORAL_TABLET | Freq: Two times a day (BID) | ORAL | 0 refills | Status: AC

## 2024-01-08 MED ORDER — INSULIN ASPART 100 UNIT/ML IJ SOLN
10.0000 [IU] | Freq: Once | INTRAMUSCULAR | Status: AC
  Administered 2024-01-08: 10 [IU] via SUBCUTANEOUS

## 2024-01-08 MED ORDER — DOXYCYCLINE HYCLATE 100 MG PO CAPS: 100.0000 mg | ORAL_CAPSULE | Freq: Two times a day (BID) | ORAL | 0 refills | Status: AC

## 2024-01-08 NOTE — ED Provider Notes (Signed)
 [facility] Provider Note   CSN: [id number] Arrival date & time: 01/08/24  0001     Patient presents with: wound on foot   Luis Orozco is a 38 y.o. male.   Patient with a history of diabetes, diverticulitis, hypertension presents with wound to his left foot.  He works as an Museum/gallery exhibitions officer and noticed that his sock was wet about 4 days ago.  He noticed a wound to his left dorsal MTP joint where he has a bunion.  He states there was a scab that he pulled off.  Has had some drainage and pain and redness since.  No fever.  Minimal pain.  No chest pain or shortness of breath.  He is hyperglycemic on arrival and states he has been out of his Mounjaro for the past 2 weeks but taking his metformin .  Did get his Mounjaro today.  Does not feel ill.  No weakness or numbness  The history is provided by the patient.       Prior to Admission medications   Medication Sig Start Date End Date Taking? Authorizing Provider  amLODipine  (NORVASC ) 5 MG tablet Take 1 tablet (5 mg total) by mouth daily. 03/20/23   Victor Lynwood DASEN, PA-C  amoxicillin -clavulanate (AUGMENTIN ) 875-125 MG tablet Take 1 tablet by mouth every 12 (twelve) hours. 03/21/23   Victor Lynwood DASEN, PA-C  dicyclomine  (BENTYL ) 20 MG tablet Take 1 tablet (20 mg total) by mouth 2 (two) times daily. 07/23/22   Blue, Soijett A, PA-C  Efinaconazole  (JUBLIA ) 10 % SOLN Apply 1 drop topically Nightly. 04/13/13   Sheard, Myeong O, DPM  famotidine  (PEPCID ) 20 MG tablet Take 1 tablet (20 mg total) by mouth 2 (two) times daily for 14 days. 04/07/20 04/21/20  Couture, Cortni S, PA-C  GREEN TEA, CAMILLIA SINENSIS, PO Take 1 tablet by mouth daily before lunch.    [provider]  metFORMIN  (GLUCOPHAGE ) 500 MG tablet Take 500 mg by mouth 2 (two) times daily with a meal.    [provider]  metoCLOPramide  (REGLAN ) 10 MG tablet Take 1 tablet (10 mg total) by mouth every 6 (six) hours. 07/27/22   Adolph Tinnie BRAVO, PA-C  omeprazole  (PRILOSEC) 20 MG capsule Take 1 capsule (20 mg total) by mouth daily. 07/23/22 08/22/22  Blue, Soijett A, PA-C  ondansetron  (ZOFRAN  ODT) 8 MG disintegrating tablet Take 1 tablet (8 mg total) by mouth every 8 (eight) hours as needed for nausea or vomiting. 09/19/20   Molpus, John, MD  ondansetron  (ZOFRAN ) 4 MG tablet Take 1 tablet (4 mg total) by mouth every 6 (six) hours. 07/23/22   Blue, Soijett A, PA-C  PROAIR HFA 108 (90 BASE) MCG/ACT inhaler  08/14/14   [provider]  sucralfate  (CARAFATE ) 1 GM/10ML suspension Take 10 mLs (1 g total) by mouth 4 (four) times daily -  with meals and at bedtime. 07/23/22   Blue, Soijett A, PA-C    Allergies: Patient has no known allergies.    Review of Systems  Constitutional:  Negative for activity change, appetite change and fever.  HENT:  Negative for congestion.   Respiratory:  Negative for cough, chest tightness and shortness of breath.   Cardiovascular:  Negative for chest pain.  Gastrointestinal:  Negative for abdominal pain, nausea and vomiting.  Genitourinary:  Negative for dysuria.  Musculoskeletal:  Negative for arthralgias and myalgias.  Skin:  Positive for wound.  Neurological:  Negative for dizziness, weakness and headaches.   all  other systems are negative except as noted in the HPI and PMH.    Updated Vital Signs BP (!) 173/110 (BP Location: Left Arm)   Pulse 97   Temp (!) 97.1 F (36.2 C)   Resp 20   Ht 6' (1.829 m)   SpO2 100%   BMI 31.87 kg/m   Physical Exam Vitals and nursing note reviewed.  Constitutional:      General: He is not in acute distress.    Appearance: He is well-developed.  HENT:     Head: Normocephalic and atraumatic.     Mouth/Throat:     Pharynx: No oropharyngeal exudate.  Eyes:     Conjunctiva/sclera: Conjunctivae normal.     Pupils: Pupils are equal, round, and reactive to light.  Neck:     Comments: No meningismus. Cardiovascular:     Rate and Rhythm: Normal rate and  regular rhythm.     Heart sounds: Normal heart sounds. No murmur heard. Pulmonary:     Effort: Pulmonary effort is normal. No respiratory distress.     Breath sounds: Normal breath sounds.  Abdominal:     Palpations: Abdomen is soft.     Tenderness: There is no abdominal tenderness. There is no guarding or rebound.  Musculoskeletal:        General: Tenderness present. Normal range of motion.     Cervical back: Normal range of motion and neck supple.     Comments: Shallow ulceration to the dorsal first MTP on the left.  No surrounding fluctuance.  Able to wiggle toes.  Intact DP and PT pulse.   Skin:    General: Skin is warm.  Neurological:     Mental Status: He is alert and oriented to person, place, and time.     Cranial Nerves: No cranial nerve deficit.     Motor: No abnormal muscle tone.     Coordination: Coordination normal.     Comments:  5/5 strength throughout. CN 2-12 intact.Equal grip strength.   Psychiatric:        Behavior: Behavior normal.     (all labs ordered are listed, but only abnormal results are displayed) Labs Reviewed  CBC WITH DIFFERENTIAL/PLATELET - Abnormal; Notable for the following components:      Result Value   Hemoglobin 12.4 (*)    HCT 34.4 (*)    MCV 78.4 (*)    All other components within normal limits  COMPREHENSIVE METABOLIC PANEL WITH GFR - Abnormal; Notable for the following components:   Glucose, Bld 470 (*)    Creatinine, Ser 1.33 (*)    Total Protein 6.0 (*)    All other components within normal limits  LACTIC ACID, PLASMA - Abnormal; Notable for the following components:   Lactic Acid, Venous 3.2 (*)    All other components within normal limits  LACTIC ACID, PLASMA - Abnormal; Notable for the following components:   Lactic Acid, Venous 2.6 (*)    All other components within normal limits  CBG MONITORING, ED - Abnormal; Notable for the following components:   Glucose-Capillary 430 (*)    All other components within normal limits   CBG MONITORING, ED - Abnormal; Notable for the following components:   Glucose-Capillary 388 (*)    All other components within normal limits  CBG MONITORING, ED - Abnormal; Notable for the following components:   Glucose-Capillary 140 (*)    All other components within normal limits  I-STAT VENOUS BLOOD GAS, ED - Abnormal; Notable for the following components:  pCO2, Ven 42.9 (*)    pO2, Ven 62 (*)    Potassium 3.3 (*)    HCT 31.0 (*)    Hemoglobin 10.5 (*)    All other components within normal limits  CULTURE, BLOOD (ROUTINE X 2)  CULTURE, BLOOD (ROUTINE X 2)  LACTIC ACID, PLASMA  LACTIC ACID, PLASMA  CBG MONITORING, ED    EKG: None  Radiology: DG Foot Complete Left Result Date: 01/08/2024 CLINICAL DATA:  foot wound Pt states he noticed today after working today his boot was wet from drainage. Pt has an open wound to left bunion. Pt is a diabetic. EXAM: LEFT FOOT - COMPLETE 3+ VIEW COMPARISON:  None Available. FINDINGS: No cortical erosion or destruction. There is no evidence of fracture or dislocation. Pes planus. Plantar calcaneal spur. Hallux valgus deformity. There is no evidence of severe arthropathy or other focal bone abnormality. Dorsal foot subcutaneus soft tissue edema. IMPRESSION: 1. No radiographic finding of acute osteomyelitis. 2.  No acute displaced fracture or dislocation. Electronically Signed   By: Morgane  Naveau M.D.   On: 01/08/2024 00:58     Procedures   Medications Ordered in the ED  sodium chloride  0.9 % bolus 1,000 mL (has no administration in time range)  amoxicillin -clavulanate (AUGMENTIN ) 875-125 MG per tablet 1 tablet (has no administration in time range)  doxycycline  (VIBRA -TABS) tablet 100 mg (has no administration in time range)  insulin  aspart (novoLOG ) injection 10 Units (has no administration in time range)                                    Medical Decision Making Amount and/or Complexity of Data Reviewed Labs: ordered.  Decision-making details documented in ED Course. Radiology: ordered and independent interpretation performed. Decision-making details documented in ED Course. ECG/medicine tests: ordered and independent interpretation performed. Decision-making details documented in ED Course.  Risk Prescription drug management.   Diabetic Foot wound.  Stable vital signs.  Hypertensive on arrival.  Hyperglycemic and has been out of his Mounjaro.  He appears well and nontoxic.  Will hydrate, check labs, rule out DKA, check x-ray to evaluate for osteomyelitis  X-ray negative for fracture or evidence of osteomyelitis.  Patient hydrated.  Given IV fluids and insulin .  Labs show hyperglycemia without DKA.  He is given IV fluids and insulin .  Lactic acid is improving.  Patient appears well and nontoxic.  Blood culture sent.  Antibiotics initiated. Wound care instructions given.  Suspect his hyperglycemia secondary to not having his Mounjaro for 2 weeks but he does have it now.  Metformin  may be contributing to lactic acidosis.  Does not appear to be in DKA with normal anion gap. pH normal on VBG.  No evidence of acidosis  Lactate has normalized after IV fluids.  Blood pressure improved to 159/98.  Patient appears well and nontoxic.  Blood cultures are pending.  Patient appears well nontoxic.  Blood sugar has improved.  Blood pressure has improved.  He is given antibiotics, wound care and podiatry follow-up.  Blood cultures are pending.  Return to the ED with new or worsening symptoms. Appreciate to discharge home with wound care p.o. antibiotics.  Follow-up with PCP as well as podiatry.  Advised wound check in 2 days.  Return to the ED sooner with worsening pain, redness, fever, chills, nausea, vomiting, drainage or other concerns.     Final diagnoses:  Diabetic foot infection (HCC)  Hyperglycemia  ED Discharge Orders     None          Dior Dominik, Garnette, MD 01/08/24 [phone]

## 2024-01-08 NOTE — ED Notes (Signed)
 Dressing applied to left upper foot area. Pt aware of how to care for wound. Supplies sent home with patient.

## 2024-01-08 NOTE — Discharge Instructions (Signed)
 Take antibiotics as prescribed.  Take your diabetic medication as prescribed and monitor your blood sugar closely.  Follow-up with the foot doctor for recheck every 1 to 2 days.  Return to the ED with new or worsening symptoms.

## 2024-01-08 NOTE — ED Triage Notes (Addendum)
 Pt states he noticed today after working today his boot were wet from drainage.  Pt has an open wound to left bunion.  Pt is a diabetic.   Pt does see podiatry regularly Pt states that area has never opened up before Pt recently had his diabetic and HTN meds changed Pt has not had his Ozempic in over a week. CBG 430

## 2024-01-13 LAB — CULTURE, BLOOD (ROUTINE X 2)
Culture: NO GROWTH
Culture: NO GROWTH
Special Requests: ADEQUATE
Special Requests: ADEQUATE
